# Patient Record
Sex: Female | Born: 1965
Health system: Southern US, Community
[De-identification: ages and names within clinical notes are randomized; demographics above are authoritative.]

## PROBLEM LIST (undated history)

## (undated) DIAGNOSIS — M797 Fibromyalgia: Secondary | ICD-10-CM

## (undated) HISTORY — DX: Fibromyalgia: M79.7

## (undated) HISTORY — PX: SHOULDER SURGERY: SHX246

## (undated) HISTORY — PX: PARTIAL HYSTERECTOMY: SHX80

---

## 2005-01-04 ENCOUNTER — Emergency Department (HOSPITAL_COMMUNITY): Admission: EM | Admit: 2005-01-04 | Discharge: 2005-01-04 | Payer: Self-pay | Admitting: Emergency Medicine

## 2005-01-16 ENCOUNTER — Emergency Department (HOSPITAL_COMMUNITY): Admission: EM | Admit: 2005-01-16 | Discharge: 2005-01-16 | Payer: Self-pay | Admitting: Emergency Medicine

## 2005-03-14 ENCOUNTER — Emergency Department (HOSPITAL_COMMUNITY): Admission: EM | Admit: 2005-03-14 | Discharge: 2005-03-14 | Payer: Self-pay | Admitting: *Deleted

## 2005-04-20 ENCOUNTER — Ambulatory Visit: Payer: Self-pay | Admitting: Psychiatry

## 2005-04-20 ENCOUNTER — Inpatient Hospital Stay (HOSPITAL_COMMUNITY): Admission: EM | Admit: 2005-04-20 | Discharge: 2005-04-27 | Payer: Self-pay | Admitting: Psychiatry

## 2018-08-08 DIAGNOSIS — Z1231 Encounter for screening mammogram for malignant neoplasm of breast: Secondary | ICD-10-CM | POA: Diagnosis not present

## 2018-08-08 DIAGNOSIS — R6889 Other general symptoms and signs: Secondary | ICD-10-CM | POA: Diagnosis not present

## 2018-08-10 DIAGNOSIS — H11133 Conjunctival pigmentations, bilateral: Secondary | ICD-10-CM | POA: Diagnosis not present

## 2018-08-10 DIAGNOSIS — H01024 Squamous blepharitis left upper eyelid: Secondary | ICD-10-CM | POA: Diagnosis not present

## 2018-08-10 DIAGNOSIS — H01022 Squamous blepharitis right lower eyelid: Secondary | ICD-10-CM | POA: Diagnosis not present

## 2018-08-10 DIAGNOSIS — R6889 Other general symptoms and signs: Secondary | ICD-10-CM | POA: Diagnosis not present

## 2018-08-10 DIAGNOSIS — E119 Type 2 diabetes mellitus without complications: Secondary | ICD-10-CM | POA: Diagnosis not present

## 2018-08-10 DIAGNOSIS — H01021 Squamous blepharitis right upper eyelid: Secondary | ICD-10-CM | POA: Diagnosis not present

## 2018-08-10 DIAGNOSIS — H02822 Cysts of right lower eyelid: Secondary | ICD-10-CM | POA: Diagnosis not present

## 2018-08-10 DIAGNOSIS — H11153 Pinguecula, bilateral: Secondary | ICD-10-CM | POA: Diagnosis not present

## 2018-08-10 DIAGNOSIS — H01025 Squamous blepharitis left lower eyelid: Secondary | ICD-10-CM | POA: Diagnosis not present

## 2018-08-10 DIAGNOSIS — H04123 Dry eye syndrome of bilateral lacrimal glands: Secondary | ICD-10-CM | POA: Diagnosis not present

## 2018-08-15 DIAGNOSIS — M797 Fibromyalgia: Secondary | ICD-10-CM | POA: Diagnosis not present

## 2018-08-15 DIAGNOSIS — E6609 Other obesity due to excess calories: Secondary | ICD-10-CM | POA: Diagnosis not present

## 2018-08-15 DIAGNOSIS — E119 Type 2 diabetes mellitus without complications: Secondary | ICD-10-CM | POA: Diagnosis not present

## 2018-08-15 DIAGNOSIS — J309 Allergic rhinitis, unspecified: Secondary | ICD-10-CM | POA: Diagnosis not present

## 2018-08-15 DIAGNOSIS — I10 Essential (primary) hypertension: Secondary | ICD-10-CM | POA: Diagnosis not present

## 2018-08-15 DIAGNOSIS — Z915 Personal history of self-harm: Secondary | ICD-10-CM | POA: Diagnosis not present

## 2018-08-15 DIAGNOSIS — F322 Major depressive disorder, single episode, severe without psychotic features: Secondary | ICD-10-CM | POA: Diagnosis not present

## 2018-08-15 DIAGNOSIS — R6889 Other general symptoms and signs: Secondary | ICD-10-CM | POA: Diagnosis not present

## 2018-08-15 DIAGNOSIS — E785 Hyperlipidemia, unspecified: Secondary | ICD-10-CM | POA: Diagnosis not present

## 2018-08-16 ENCOUNTER — Encounter: Payer: Self-pay | Admitting: Allergy

## 2018-08-16 ENCOUNTER — Ambulatory Visit (INDEPENDENT_AMBULATORY_CARE_PROVIDER_SITE_OTHER): Payer: Medicare HMO | Admitting: Allergy

## 2018-08-16 VITALS — BP 132/82 | HR 76 | Temp 98.0°F | Resp 16 | Ht 69.0 in | Wt 247.6 lb

## 2018-08-16 DIAGNOSIS — Z720 Tobacco use: Secondary | ICD-10-CM

## 2018-08-16 DIAGNOSIS — J309 Allergic rhinitis, unspecified: Secondary | ICD-10-CM | POA: Diagnosis not present

## 2018-08-16 DIAGNOSIS — J3089 Other allergic rhinitis: Secondary | ICD-10-CM

## 2018-08-16 DIAGNOSIS — R062 Wheezing: Secondary | ICD-10-CM

## 2018-08-16 DIAGNOSIS — H1013 Acute atopic conjunctivitis, bilateral: Secondary | ICD-10-CM

## 2018-08-16 DIAGNOSIS — R6889 Other general symptoms and signs: Secondary | ICD-10-CM | POA: Diagnosis not present

## 2018-08-16 DIAGNOSIS — H1010 Acute atopic conjunctivitis, unspecified eye: Secondary | ICD-10-CM | POA: Diagnosis not present

## 2018-08-16 MED ORDER — CARBINOXAMINE MALEATE 6 MG PO TABS: 6.0000 mg | ORAL_TABLET | Freq: Two times a day (BID) | ORAL | 3 refills | Status: DC

## 2018-08-16 MED ORDER — ALBUTEROL SULFATE HFA 108 (90 BASE) MCG/ACT IN AERS: INHALATION_SPRAY | RESPIRATORY_TRACT | 5 refills | Status: DC

## 2018-08-16 MED ORDER — OLOPATADINE HCL 0.2 % OP SOLN: 1.0000 [drp] | Freq: Every day | OPHTHALMIC | 3 refills | Status: DC

## 2018-08-16 MED ORDER — MONTELUKAST SODIUM 10 MG PO TABS: 10.0000 mg | ORAL_TABLET | Freq: Every day | ORAL | 3 refills | Status: DC

## 2018-08-16 NOTE — Progress Notes (Signed)
New Patient Note  RE: Virginia Garcia MRN: [id number] DOB: 1966-02-16 Date of Office Visit: 08/16/2018  Referring provider: No ref. provider found Primary care provider: Renaye Rakers, MD  Chief Complaint: Allergies  History of present illness: Virginia Garcia is a 51 y.o. female presenting today for evaluation of allergies.  She has been dealing with allergy symptoms for the past 3 years.  She states she used to have seasonal allergies but symptoms lately have not gone away as the seasons have changed.  She is taking xyzal and using fluticasone spray. She has tried zyrtec, claritin, allegra in the past which has not helped relieve her symptoms.  She reports her symptoms include runny and stuffy nose, irritated ears, sneezing, throat clearing.  She states her eye symptoms are not that bothersome but does report some itch and watering.  She feels that the symptoms have been progressively worsening.  She has had allergy testing before about several years ago by an allergist in [place].  She recalls being positive to grass, dog, dust mites, tree.  She also performed allergy shots but states she was having rashes with the injections and thus stopped and thinks she only did about 6 months or so.    She is a smoker. She has an albuterol that she uses about once a week for wheezing.  She states that when she does need to use it she is using it 1-2 times that day.  She states she was provided with the albuterol because of wheezing that she states is due to her smoking.  She denies any significant cough no sputum production and no chest tightness.  She denies nighttime awakenings.  She denies any ED or urgent care visits or hospitalizations for her breathing related issues.  She denies any need for oral steroids.  No history of eczema or food allergy.    Review of systems: Review of Systems  Constitutional: Negative for chills, fever and malaise/fatigue.  HENT: Positive for congestion. Negative for  ear discharge, ear pain, nosebleeds, sinus pain and sore throat.   Eyes: Negative for pain, discharge and redness.  Respiratory: Positive for wheezing. Negative for cough, sputum production and shortness of breath.   Cardiovascular: Negative for chest pain.  Gastrointestinal: Negative for abdominal pain, constipation, diarrhea, heartburn, nausea and vomiting.  Musculoskeletal: Negative for joint pain.  Skin: Negative for itching and rash.  Neurological: Negative for headaches.    All other systems negative unless noted above in HPI  Past medical history: Past Medical History:  Diagnosis Date  . Fibromyalgia     Past surgical history: Past Surgical History:  Procedure Laterality Date  . PARTIAL HYSTERECTOMY     1987  . SHOULDER SURGERY     2015    Family history:  History reviewed. No pertinent family history.  Social history: She lives in a house with carpeting with electric heating and central cooling.  No pets in the home.  No concern for water damage, mildew or roaches in the home.  She is disabled and currently is not working.  She is a current everyday smoker of half a pack for the past 45 years  Medication List: Allergies as of 08/16/2018      Reactions   Meloxicam    Bruising    Paxil [paroxetine Hcl] Hives      Medication List        Accurate as of 08/16/18 11:01 AM. Always use your most recent med list.  FLUoxetine 40 MG capsule Commonly known as:  PROZAC Take 40 mg by mouth daily.   fluticasone 50 MCG/ACT nasal spray Commonly known as:  FLONASE one TO two SPRAYS in each nostril EVERY DAY   furosemide 20 MG tablet Commonly known as:  LASIX Take 20 mg by mouth.   gabapentin 300 MG capsule Commonly known as:  NEURONTIN Take 300 mg by mouth 3 (three) times daily.   levocetirizine 5 MG tablet Commonly known as:  XYZAL Take 5 mg by mouth daily.   losartan-hydrochlorothiazide 50-12.5 MG tablet Commonly known as:  HYZAAR Take 1 tablet  by mouth daily. for blood pressure   Melatonin 5 MG Tabs   pantoprazole 40 MG tablet Commonly known as:  PROTONIX Take 40 mg by mouth daily.   pregabalin 100 MG capsule Commonly known as:  LYRICA Take 100 mg by mouth 2 (two) times daily.   rosuvastatin 10 MG tablet Commonly known as:  CRESTOR TK 1 T PO D   SYNJARDY 03-999 MG Tabs Generic drug:  Empagliflozin-metFORMIN HCl TK 1 T PO BID WITH FOOD   traZODone 50 MG tablet Commonly known as:  DESYREL Take 50 mg by mouth at bedtime.   TRULICITY 0.75 MG/0.5ML Sopn Generic drug:  Dulaglutide INJECT 1 DOSE SQ Q WEEK   VITAMIN C PO Take by mouth.   VITAMIN D PO Take by mouth.       Known medication allergies: Allergies  Allergen Reactions  . Meloxicam     Bruising   . Paxil [Paroxetine Hcl] Hives     Physical examination: Blood pressure 132/82, pulse 76, temperature 98 F (36.7 C), temperature source Oral, resp. rate 16, height 5\' 9"  (1.753 m), weight 247 lb 9.6 oz (112.3 kg), SpO2 98 %.  General: Alert, interactive, in no acute distress. HEENT: PERRLA, TMs pearly gray, turbinates moderately edematous with clear discharge, post-pharynx non erythematous. Neck: Supple without lymphadenopathy. Lungs: Clear to auscultation without wheezing, rhonchi or rales. {no increased work of breathing. CV: Normal S1, S2 without murmurs. Abdomen: Nondistended, nontender. Skin: Warm and dry, without lesions or rashes. Extremities:  No clubbing, cyanosis or edema. Neuro:   Grossly intact.  Diagnositics/Labs:  Spirometry: FEV1: 1.99L  73%, FVC: 2.66L 78%  Allergy testing: Histamine was nonreactive this test is not interpretable Allergy testing results were read and interpreted by provider, documented by clinical staff.   Assessment and plan:   Allergic rhinoconjunctivitis   - skin testing today was not reactive thus will obtain environmental allergen panel   - stop xyzal and will have you trial Ryvent 6mg  twice a day   -  trial dymista 1 spray each nostril twice a day.  This is a combination nasal spray with Flonase + Astelin (nasal antihistamine).  This helps with both nasal congestion and drainage.  If Dymista is not covered then will prescribe Astelin separately   - for itchy/watery/red eyes use Pazeo or Pataday 1 drop each eye daily as needed   - nasal saline rinse prior to use of nasal sprays to help clean out the nose  - immunotherapy was discussed today if medication management is not effective  Wheezing   - lung function testing today is slightly low   - start singulair 10mg  daily - take at bedtime   - have access to albuterol inhaler 2 puffs every 4-6 hours as needed for cough/wheeze/shortness of breath/chest tightness.  May use 15-20 minutes prior to activity.   Monitor frequency of use.     -  discussed smoking cessation and identifying a quit date  Control goals:   Full participation in all desired activities (may need albuterol before activity)  Albuterol use two time or less a week on average (not counting use with activity)  Cough interfering with sleep two time or less a month  Oral steroids no more than once a year  No hospitalizations  Tobacco use  - discussed smoking cessation and identifying a quit date.  She does appear interested in quitting smoking.  - lung function is slighlty reduced for age and discussed smoking cessation would help to reduce how fast she loses her lung function.    Follow-up 3-4 months or sooner if needed  I appreciate the opportunity to take part in Tyniya's care. Please do not hesitate to contact me with questions.  Sincerely,   Margo Aye, MD Allergy/Immunology [facility]

## 2018-08-16 NOTE — Patient Instructions (Addendum)
Allergies   - skin testing today was not reactive thus will obtain environmental allergen panel   - stop xyzal and will have you trial Ryvent 6mg  twice a day   - trial dymista 1 spray each nostril twice a day.  This is a combination nasal spray with Flonase + Astelin (nasal antihistamine).  This helps with both nasal congestion and drainage.  If Dymista is not covered then will prescribe Astelin separately   - for itchy/watery/red eyes use Pazeo or Pataday 1 drop each eye daily as needed   - nasal saline rinse prior to use of nasal sprays to help clean out the nose  Wheezing   - lung function testing today is slightly low   - start singulair 10mg  daily - take at bedtime   - have access to albuterol inhaler 2 puffs every 4-6 hours as needed for cough/wheeze/shortness of breath/chest tightness.  May use 15-20 minutes prior to activity.   Monitor frequency of use.    Control goals:   Full participation in all desired activities (may need albuterol before activity)  Albuterol use two time or less a week on average (not counting use with activity)  Cough interfering with sleep two time or less a month  Oral steroids no more than once a year  No hospitalizations  Follow-up 3-4 months or sooner if needed

## 2018-08-17 ENCOUNTER — Other Ambulatory Visit: Payer: Self-pay | Admitting: *Deleted

## 2018-08-17 MED ORDER — ALBUTEROL SULFATE HFA 108 (90 BASE) MCG/ACT IN AERS: 2.0000 | INHALATION_SPRAY | RESPIRATORY_TRACT | 5 refills | Status: DC | PRN

## 2018-08-19 LAB — IGE+ALLERGENS ZONE 2(30)
Alternaria Alternata IgE: <0.1 kU/L
Bermuda Grass IgE: <0.1 kU/L
Cat Dander IgE: <0.1 kU/L
Cedar, Mountain IgE: <0.1 kU/L
Common Silver Birch IgE: <0.1 kU/L
Dog Dander IgE: <0.1 kU/L
Hickory, White IgE: <0.1 kU/L
IGE (IMMUNOGLOBULIN E), SERUM: 11 [IU]/mL (ref 6–495)
Mucor Racemosus IgE: <0.1 kU/L
Mugwort IgE Qn: <0.1 kU/L
Nettle IgE: <0.1 kU/L
Oak, White IgE: <0.1 kU/L
Plantain, English IgE: <0.1 kU/L
Stemphylium Herbarum IgE: <0.1 kU/L
Sweet gum IgE RAST Ql: <0.1 kU/L
Timothy Grass IgE: <0.1 kU/L
White Mulberry IgE: <0.1 kU/L

## 2018-08-19 LAB — CBC WITH DIFFERENTIAL/PLATELET
BASOS ABS: 0 10*3/uL (ref 0.0–0.2)
Basos: 1 %
EOS (ABSOLUTE): 0.2 10*3/uL (ref 0.0–0.4)
EOS: 3 %
HEMATOCRIT: 38.7 % (ref 34.0–46.6)
HEMOGLOBIN: 13.2 g/dL (ref 11.1–15.9)
IMMATURE GRANS (ABS): 0 10*3/uL (ref 0.0–0.1)
IMMATURE GRANULOCYTES: 0 %
LYMPHS: 48 %
Lymphocytes Absolute: 3.3 10*3/uL — ABNORMAL HIGH (ref 0.7–3.1)
MCH: 30.4 pg (ref 26.6–33.0)
MCHC: 34.1 g/dL (ref 31.5–35.7)
MCV: 89 fL (ref 79–97)
MONOCYTES: 5 %
Monocytes Absolute: 0.3 10*3/uL (ref 0.1–0.9)
Neutrophils Absolute: 3 10*3/uL (ref 1.4–7.0)
Neutrophils: 43 %
Platelets: 335 10*3/uL (ref 150–450)
RBC: 4.34 x10E6/uL (ref 3.77–5.28)
RDW: 13.5 % (ref 12.3–15.4)
WBC: 6.8 10*3/uL (ref 3.4–10.8)

## 2018-08-29 DIAGNOSIS — E6609 Other obesity due to excess calories: Secondary | ICD-10-CM | POA: Diagnosis not present

## 2018-08-29 DIAGNOSIS — F322 Major depressive disorder, single episode, severe without psychotic features: Secondary | ICD-10-CM | POA: Diagnosis not present

## 2018-08-29 DIAGNOSIS — E119 Type 2 diabetes mellitus without complications: Secondary | ICD-10-CM | POA: Diagnosis not present

## 2018-08-29 DIAGNOSIS — R6889 Other general symptoms and signs: Secondary | ICD-10-CM | POA: Diagnosis not present

## 2018-08-29 DIAGNOSIS — Z6837 Body mass index (BMI) 37.0-37.9, adult: Secondary | ICD-10-CM | POA: Diagnosis not present

## 2018-08-29 DIAGNOSIS — E785 Hyperlipidemia, unspecified: Secondary | ICD-10-CM | POA: Diagnosis not present

## 2018-08-29 DIAGNOSIS — M797 Fibromyalgia: Secondary | ICD-10-CM | POA: Diagnosis not present

## 2018-08-29 DIAGNOSIS — Z915 Personal history of self-harm: Secondary | ICD-10-CM | POA: Diagnosis not present

## 2018-09-12 ENCOUNTER — Other Ambulatory Visit: Payer: Self-pay | Admitting: Family Medicine

## 2018-09-12 ENCOUNTER — Other Ambulatory Visit (HOSPITAL_COMMUNITY)
Admission: RE | Admit: 2018-09-12 | Discharge: 2018-09-12 | Disposition: A | Discharge status: Home / Self Care | Payer: Medicare HMO | Source: Ambulatory Visit | Attending: Family Medicine | Admitting: Family Medicine

## 2018-09-12 DIAGNOSIS — N958 Other specified menopausal and perimenopausal disorders: Secondary | ICD-10-CM | POA: Diagnosis not present

## 2018-09-12 DIAGNOSIS — L292 Pruritus vulvae: Secondary | ICD-10-CM | POA: Diagnosis not present

## 2018-09-12 DIAGNOSIS — R6889 Other general symptoms and signs: Secondary | ICD-10-CM | POA: Diagnosis not present

## 2018-09-12 DIAGNOSIS — M797 Fibromyalgia: Secondary | ICD-10-CM | POA: Diagnosis not present

## 2018-09-12 DIAGNOSIS — I10 Essential (primary) hypertension: Secondary | ICD-10-CM | POA: Diagnosis not present

## 2018-09-12 DIAGNOSIS — F331 Major depressive disorder, recurrent, moderate: Secondary | ICD-10-CM | POA: Diagnosis not present

## 2018-09-12 DIAGNOSIS — N898 Other specified noninflammatory disorders of vagina: Secondary | ICD-10-CM | POA: Insufficient documentation

## 2018-09-13 DIAGNOSIS — N898 Other specified noninflammatory disorders of vagina: Secondary | ICD-10-CM | POA: Diagnosis not present

## 2018-09-14 LAB — URINE CYTOLOGY ANCILLARY ONLY
BACTERIAL VAGINITIS: NEGATIVE
Candida vaginitis: NEGATIVE
Chlamydia: NEGATIVE
Neisseria Gonorrhea: NEGATIVE
Trichomonas: NEGATIVE

## 2018-11-06 ENCOUNTER — Other Ambulatory Visit: Payer: Self-pay

## 2018-11-06 MED ORDER — ALBUTEROL SULFATE HFA 108 (90 BASE) MCG/ACT IN AERS: 2.0000 | INHALATION_SPRAY | RESPIRATORY_TRACT | 4 refills | Status: AC | PRN

## 2018-11-06 MED ORDER — MONTELUKAST SODIUM 10 MG PO TABS: 10.0000 mg | ORAL_TABLET | Freq: Every day | ORAL | 0 refills | Status: DC

## 2018-11-06 MED ORDER — OLOPATADINE HCL 0.2 % OP SOLN: 1.0000 [drp] | Freq: Every day | OPHTHALMIC | 3 refills | Status: DC

## 2018-11-06 NOTE — Telephone Encounter (Signed)
Refills sent in for montelukast, Ventolin HFA, and Olopatadine  11/06/18 sent to [facility] pharmacy

## 2018-11-12 ENCOUNTER — Other Ambulatory Visit: Payer: Self-pay | Admitting: Allergy and Immunology

## 2018-11-12 DIAGNOSIS — R6889 Other general symptoms and signs: Secondary | ICD-10-CM | POA: Diagnosis not present

## 2018-11-12 DIAGNOSIS — E669 Obesity, unspecified: Secondary | ICD-10-CM | POA: Diagnosis not present

## 2018-11-13 ENCOUNTER — Other Ambulatory Visit: Payer: Self-pay | Admitting: Allergy and Immunology

## 2018-12-19 ENCOUNTER — Ambulatory Visit (INDEPENDENT_AMBULATORY_CARE_PROVIDER_SITE_OTHER): Payer: Medicare HMO | Admitting: Allergy

## 2018-12-19 ENCOUNTER — Encounter: Payer: Self-pay | Admitting: Allergy

## 2018-12-19 VITALS — BP 128/82 | HR 87 | Resp 16

## 2018-12-19 DIAGNOSIS — J310 Chronic rhinitis: Secondary | ICD-10-CM | POA: Diagnosis not present

## 2018-12-19 DIAGNOSIS — Z720 Tobacco use: Secondary | ICD-10-CM | POA: Diagnosis not present

## 2018-12-19 DIAGNOSIS — H1089 Other conjunctivitis: Secondary | ICD-10-CM | POA: Diagnosis not present

## 2018-12-19 DIAGNOSIS — R062 Wheezing: Secondary | ICD-10-CM | POA: Diagnosis not present

## 2018-12-19 DIAGNOSIS — R6889 Other general symptoms and signs: Secondary | ICD-10-CM | POA: Diagnosis not present

## 2018-12-19 MED ORDER — OLOPATADINE HCL 0.2 % OP SOLN: OPHTHALMIC | 1 refills | Status: AC

## 2018-12-19 MED ORDER — AZELASTINE HCL 0.1 % NA SOLN: 2.0000 | Freq: Two times a day (BID) | NASAL | 5 refills | Status: DC

## 2018-12-19 NOTE — Patient Instructions (Addendum)
Non-allergic rhinitis   - skin and blood allergy testing was negative   - trial Karbinal take 8mg  (73ml) twice a day - this is an antihistamine   - use dymista 1 spray each nostril twice a day.  This is a combination nasal spray with Flonase + Astelin (nasal antihistamine).  This helps with both nasal congestion and drainage.  If Dymista is not covered then will prescribe Astelin separately   - for itchy/watery/red eyes use Olopatadine 1 drop each eye daily as needed   - nasal saline rinse prior to use of nasal sprays to help clean out the nose  Wheezing   - continue singulair 10mg  daily - take at bedtime   - have access to albuterol inhaler 2 puffs every 4-6 hours as needed for cough/wheeze/shortness of breath/chest tightness.  May use 15-20 minutes prior to activity.   Monitor frequency of use.    Control goals:   Full participation in all desired activities (may need albuterol before activity)  Albuterol use two time or less a week on average (not counting use with activity)  Cough interfering with sleep two time or less a month  Oral steroids no more than once a year  No hospitalizations  Tobacco use   - we discussed today thinking about cutting back smoking and setting a goal to cut back  Follow-up 3-4 months or sooner if needed

## 2018-12-19 NOTE — Progress Notes (Signed)
Follow-up Note  RE: Virginia Garcia MRN: [id number] DOB: [birth year] Date of Office Visit: 12/19/2018   History of present illness: Virginia Garcia is a 53 y.o. female presenting today for follow-up of nonallergic rhinitis with conjunctivitis was wheezing secondary to tobacco use.  She was last seen in the office on August 16, 2018 by myself.  At that visit her allergy skin prick testing was not reactive thus we elected to obtain serum IgE testing which was negative. She states that she did complete the Dymista sample provided after last visit and that this was a somewhat helpful for her however when the sample ran out she resume use of Flonase which she does not feel to be effective at all.  She states she has used the olopatadine eyedrop when she develops itchy watery eyes and this does help.  She was not able to get the RyVent as she states it was too expensive. She states she is still having runny nose and itchy eyes. She is still smoking about half a pack every 3 days or so and she has not been able to set a quit date or thought about cutting back.  She states however she would like to eventually quit at some point. She states she has needed to use her albuterol inhaler about twice a month when she does feel a bit short of breath and wheezing and it does provide relief.  Review of systems: Review of Systems  Constitutional: Negative for chills, fever and malaise/fatigue.  HENT: Positive for congestion. Negative for ear discharge, nosebleeds, sinus pain and sore throat.   Eyes: Negative for pain, discharge and redness.  Respiratory: Positive for wheezing. Negative for cough, sputum production and shortness of breath.   Cardiovascular: Negative for chest pain.  Gastrointestinal: Negative for abdominal pain, constipation, diarrhea, heartburn, nausea and vomiting.  Musculoskeletal: Negative for joint pain.  Skin: Negative for itching and rash.  Neurological: Negative for headaches.    All  other systems negative unless noted above in HPI  Past medical/social/surgical/family history have been reviewed and are unchanged unless specifically indicated below.  No changes  Medication List: Allergies as of 12/19/2018      Reactions   Meloxicam    Bruising    Paxil [paroxetine Hcl] Hives      Medication List       Accurate as of December 19, 2018  2:42 PM. Always use your most recent med list.        albuterol 108 (90 Base) MCG/ACT inhaler Commonly known as:  VENTOLIN HFA Inhale 2 puffs into the lungs every 4 (four) hours as needed for wheezing or shortness of breath.   azelastine 0.1 % nasal spray Commonly known as:  ASTELIN Place 2 sprays into both nostrils 2 (two) times daily.   Carbinoxamine Maleate 6 MG Tabs Commonly known as:  RYVENT Take 6 mg by mouth 2 (two) times daily.   FLUoxetine 40 MG capsule Commonly known as:  PROZAC Take 40 mg by mouth daily.   fluticasone 50 MCG/ACT nasal spray Commonly known as:  FLONASE one TO two SPRAYS in each nostril EVERY DAY   furosemide 20 MG tablet Commonly known as:  LASIX Take 20 mg by mouth.   gabapentin 300 MG capsule Commonly known as:  NEURONTIN Take 300 mg by mouth 3 (three) times daily.   losartan-hydrochlorothiazide 50-12.5 MG tablet Commonly known as:  HYZAAR Take 1 tablet by mouth daily. for blood pressure   Melatonin 5 MG Tabs  montelukast 10 MG tablet Commonly known as:  SINGULAIR TAKE 1 TABLET (10 MG TOTAL) BY MOUTH AT BEDTIME.   Olopatadine HCl 0.2 % Soln INSTILL 1 DROP INTO BOTH EYES DAILY.   pantoprazole 40 MG tablet Commonly known as:  PROTONIX Take 40 mg by mouth daily.   pregabalin 100 MG capsule Commonly known as:  LYRICA Take 100 mg by mouth 2 (two) times daily.   rosuvastatin 10 MG tablet Commonly known as:  CRESTOR TK 1 T PO D   SYNJARDY 03-999 MG Tabs Generic drug:  Empagliflozin-metFORMIN HCl TK 1 T PO BID WITH FOOD   traZODone 50 MG tablet Commonly known as:   DESYREL Take 50 mg by mouth at bedtime.   TRULICITY 0.75 MG/0.5ML Sopn Generic drug:  Dulaglutide INJECT 1 DOSE SQ Q WEEK   VITAMIN C PO Take by mouth.   VITAMIN D PO Take by mouth.       Known medication allergies: Allergies  Allergen Reactions  . Meloxicam     Bruising   . Paxil [Paroxetine Hcl] Hives     Physical examination: Blood pressure 128/82, pulse 87, resp. rate 16, SpO2 97 %.  General: Alert, interactive, in no acute distress. HEENT: PERRLA, TMs pearly gray, turbinates mildly edematous without discharge, post-pharynx non erythematous. Neck: Supple without lymphadenopathy. Lungs: Clear to auscultation without wheezing, rhonchi or rales. {no increased work of breathing. CV: Normal S1, S2 without murmurs. Abdomen: Nondistended, nontender. Skin: Warm and dry, without lesions or rashes. Extremities:  No clubbing, cyanosis or edema. Neuro:   Grossly intact.  Diagnositics/Labs: Labs:  Component     Latest Ref Rng & Units 08/16/2018  IgE (Immunoglobulin E), Serum     6 - 495 IU/mL 11  D Pteronyssinus IgE     Class 0 kU/L <0.10  D Farinae IgE     Class 0 kU/L <0.10  Cat Dander IgE     Class 0 kU/L <0.10  Dog Dander IgE     Class 0 kU/L <0.10  [place] Grass IgE     Class 0 kU/L <0.10  Timothy Grass IgE     Class 0 kU/L <0.10  Johnson Grass IgE     Class 0 kU/L <0.10  Bahia Grass IgE     Class 0 kU/L <0.10  Cockroach, American IgE     Class 0 kU/L <0.10  Penicillium Chrysogen IgE     Class 0 kU/L <0.10  Cladosporium Herbarum IgE     Class 0 kU/L <0.10  Aspergillus Fumigatus IgE     Class 0 kU/L <0.10  Mucor Racemosus IgE     Class 0 kU/L <0.10  Alternaria Alternata IgE     Class 0 kU/L <0.10  Stemphylium Herbarum IgE     Class 0 kU/L <0.10  Common Silver Charletta CousinBirch IgE     Class 0 kU/L <0.10  Oak, White IgE     Class 0 kU/L <0.10  Elm, American IgE     Class 0 kU/L <0.10  Maple/Box Elder IgE     Class 0 kU/L <0.10  Hickory, White IgE      Class 0 kU/L <0.10  Amer Sycamore IgE Qn     Class 0 kU/L <0.10  White Mulberry IgE     Class 0 kU/L <0.10  Sweet gum IgE RAST Ql     Class 0 kU/L <0.10  Cedar, [place] IgE     Class 0 kU/L <0.10  Ragweed, Short IgE     Class 0  kU/L <0.10  Mugwort IgE Qn     Class 0 kU/L <0.10  Plantain, English IgE     Class 0 kU/L <0.10  Pigweed, Rough IgE     Class 0 kU/L <0.10  Sheep Sorrel IgE Qn     Class 0 kU/L <0.10  Nettle IgE     Class 0 kU/L <0.10   Component     Latest Ref Rng & Units 08/16/2018  WBC     3.4 - 10.8 x10E3/uL 6.8  RBC     3.77 - 5.28 x10E6/uL 4.34  Hemoglobin     11.1 - 15.9 g/dL 16.113.2  HCT     09.634.0 - 04.546.6 % 38.7  MCV     79 - 97 fL 89  MCH     26.6 - 33.0 pg 30.4  MCHC     31.5 - 35.7 g/dL 40.934.1  RDW     81.112.3 - 91.415.4 % 13.5  Platelets     150 - 450 x10E3/uL 335  Neutrophils     Not Estab. % 43  Lymphs     Not Estab. % 48  Monocytes     Not Estab. % 5  Eos     Not Estab. % 3  Basos     Not Estab. % 1  NEUT#     1.4 - 7.0 x10E3/uL 3.0  Lymphocyte #     0.7 - 3.1 x10E3/uL 3.3 (H)  Monocytes Absolute     0.1 - 0.9 x10E3/uL 0.3  EOS (ABSOLUTE)     0.0 - 0.4 x10E3/uL 0.2  Basophils Absolute     0.0 - 0.2 x10E3/uL 0.0  Immature Granulocytes     Not Estab. % 0  Immature Grans (Abs)     0.0 - 0.1 x10E3/uL 0.0   Assessment and plan:   Non-allergic rhinitis   - skin and blood allergy testing was negative   - trial Karbinal take 8mg  (10ml) twice a day - this is an antihistamine   - use dymista 1 spray each nostril twice a day.  This is a combination nasal spray with Flonase + Astelin (nasal antihistamine).  This helps with both nasal congestion and drainage.  If Dymista is not covered then will prescribe Astelin separately   - for itchy/watery/red eyes use Olopatadine 1 drop each eye daily as needed   - nasal saline rinse prior to use of nasal sprays to help clean out the nose  Wheezing   - continue singulair 10mg  daily - take at bedtime   -  have access to albuterol inhaler 2 puffs every 4-6 hours as needed for cough/wheeze/shortness of breath/chest tightness.  May use 15-20 minutes prior to activity.   Monitor frequency of use.    Control goals:   Full participation in all desired activities (may need albuterol before activity)  Albuterol use two time or less a week on average (not counting use with activity)  Cough interfering with sleep two time or less a month  Oral steroids no more than once a year  No hospitalizations  Tobacco use   - we discussed today thinking about cutting back smoking and setting a goal date to cut back  Follow-up 3-4 months or sooner if needed  I appreciate the opportunity to take part in Davianna's care. Please do not hesitate to contact me with questions.  Sincerely,   Margo AyeShaylar , MD Allergy/Immunology [facility]

## 2018-12-26 DIAGNOSIS — E6609 Other obesity due to excess calories: Secondary | ICD-10-CM | POA: Diagnosis not present

## 2018-12-26 DIAGNOSIS — F331 Major depressive disorder, recurrent, moderate: Secondary | ICD-10-CM | POA: Diagnosis not present

## 2018-12-26 DIAGNOSIS — E119 Type 2 diabetes mellitus without complications: Secondary | ICD-10-CM | POA: Diagnosis not present

## 2018-12-26 DIAGNOSIS — R6889 Other general symptoms and signs: Secondary | ICD-10-CM | POA: Diagnosis not present

## 2018-12-26 DIAGNOSIS — M797 Fibromyalgia: Secondary | ICD-10-CM | POA: Diagnosis not present

## 2018-12-26 DIAGNOSIS — I959 Hypotension, unspecified: Secondary | ICD-10-CM | POA: Diagnosis not present

## 2018-12-26 DIAGNOSIS — Z915 Personal history of self-harm: Secondary | ICD-10-CM | POA: Diagnosis not present

## 2018-12-31 DIAGNOSIS — R6889 Other general symptoms and signs: Secondary | ICD-10-CM | POA: Diagnosis not present

## 2018-12-31 DIAGNOSIS — Z0000 Encounter for general adult medical examination without abnormal findings: Secondary | ICD-10-CM | POA: Diagnosis not present

## 2018-12-31 DIAGNOSIS — M797 Fibromyalgia: Secondary | ICD-10-CM | POA: Diagnosis not present

## 2018-12-31 DIAGNOSIS — E785 Hyperlipidemia, unspecified: Secondary | ICD-10-CM | POA: Diagnosis not present

## 2018-12-31 DIAGNOSIS — E669 Obesity, unspecified: Secondary | ICD-10-CM | POA: Diagnosis not present

## 2019-01-14 DIAGNOSIS — Z915 Personal history of self-harm: Secondary | ICD-10-CM | POA: Diagnosis not present

## 2019-01-14 DIAGNOSIS — M797 Fibromyalgia: Secondary | ICD-10-CM | POA: Diagnosis not present

## 2019-01-14 DIAGNOSIS — F331 Major depressive disorder, recurrent, moderate: Secondary | ICD-10-CM | POA: Diagnosis not present

## 2019-01-14 DIAGNOSIS — R6889 Other general symptoms and signs: Secondary | ICD-10-CM | POA: Diagnosis not present

## 2019-01-14 DIAGNOSIS — E785 Hyperlipidemia, unspecified: Secondary | ICD-10-CM | POA: Diagnosis not present

## 2019-01-14 DIAGNOSIS — E669 Obesity, unspecified: Secondary | ICD-10-CM | POA: Diagnosis not present

## 2019-01-14 DIAGNOSIS — I10 Essential (primary) hypertension: Secondary | ICD-10-CM | POA: Diagnosis not present

## 2019-02-06 ENCOUNTER — Other Ambulatory Visit: Payer: Self-pay

## 2019-02-06 MED ORDER — MONTELUKAST SODIUM 10 MG PO TABS: 10.0000 mg | ORAL_TABLET | Freq: Every day | ORAL | 1 refills | Status: DC

## 2019-03-20 DIAGNOSIS — M797 Fibromyalgia: Secondary | ICD-10-CM | POA: Diagnosis not present

## 2019-03-20 DIAGNOSIS — F331 Major depressive disorder, recurrent, moderate: Secondary | ICD-10-CM | POA: Diagnosis not present

## 2019-03-20 DIAGNOSIS — E6609 Other obesity due to excess calories: Secondary | ICD-10-CM | POA: Diagnosis not present

## 2019-03-20 DIAGNOSIS — I10 Essential (primary) hypertension: Secondary | ICD-10-CM | POA: Diagnosis not present

## 2019-03-20 DIAGNOSIS — R1013 Epigastric pain: Secondary | ICD-10-CM | POA: Diagnosis not present

## 2019-03-20 DIAGNOSIS — R6889 Other general symptoms and signs: Secondary | ICD-10-CM | POA: Diagnosis not present

## 2019-03-20 DIAGNOSIS — Z7189 Other specified counseling: Secondary | ICD-10-CM | POA: Diagnosis not present

## 2019-03-20 DIAGNOSIS — E1169 Type 2 diabetes mellitus with other specified complication: Secondary | ICD-10-CM | POA: Diagnosis not present

## 2019-03-27 ENCOUNTER — Ambulatory Visit: Payer: Medicare HMO | Admitting: Allergy

## 2019-03-27 ENCOUNTER — Telehealth (INDEPENDENT_AMBULATORY_CARE_PROVIDER_SITE_OTHER): Payer: Medicare HMO | Admitting: Allergy

## 2019-03-27 ENCOUNTER — Other Ambulatory Visit: Payer: Self-pay

## 2019-03-27 ENCOUNTER — Encounter: Payer: Self-pay | Admitting: Allergy

## 2019-03-27 DIAGNOSIS — J310 Chronic rhinitis: Secondary | ICD-10-CM

## 2019-03-27 DIAGNOSIS — R062 Wheezing: Secondary | ICD-10-CM

## 2019-03-27 DIAGNOSIS — H1089 Other conjunctivitis: Secondary | ICD-10-CM | POA: Diagnosis not present

## 2019-03-27 MED ORDER — FLUTICASONE PROPIONATE 50 MCG/ACT NA SUSP: 2.0000 | Freq: Every day | NASAL | 5 refills | Status: DC

## 2019-03-27 MED ORDER — CARBINOXAMINE MALEATE ER 4 MG/5ML PO SUER: 8.0000 mg | Freq: Two times a day (BID) | ORAL | 5 refills | Status: DC

## 2019-03-27 MED ORDER — MONTELUKAST SODIUM 10 MG PO TABS: 10.0000 mg | ORAL_TABLET | Freq: Every day | ORAL | 1 refills | Status: DC

## 2019-03-27 MED ORDER — AZELASTINE HCL 0.1 % NA SOLN: 2.0000 | Freq: Two times a day (BID) | NASAL | 5 refills | Status: DC

## 2019-03-27 MED ORDER — CARBINOXAMINE MALEATE ER 4 MG/5ML PO SUER: 8.0000 mg | Freq: Two times a day (BID) | ORAL | 5 refills | Status: AC

## 2019-03-27 NOTE — Progress Notes (Signed)
RE: Virginia Garcia MRN: [id number] DOB: [birth year] Date of Telemedicine Visit: 03/27/2019  Referring provider: Renaye Rakers, MD Primary care provider: Renaye Rakers, MD  Chief Complaint: Non-allergic rhinitis   Telemedicine Follow Up Visit via MyChart Video: I connected with Virginia Garcia for a follow up on 03/27/19 by telephone and verified that I am speaking with the correct person using two identifiers.   I discussed the limitations, risks, security and privacy concerns of performing an evaluation and management service by telephone and the availability of in person appointments. I also discussed with the patient that there may be a patient responsible charge related to this service. The patient expressed understanding and agreed to proceed.  Patient is at home.  Provider is at the office.  Visit start time: 0955 Visit end time: 74 Insurance consent/check in by: Marlene Bast Medical consent and medical assistant/nurse: Darreld Mclean S  History of Present Illness: She is a 53 y.o. female, who is being followed for non-allergic rhinitis, wheezing. Her previous allergy office visit was on 12/19/18 with Dr. Delorse Lek.   She states that her symptoms have not really gotten any better since her last visit.  However she does state that the [place] was helpful but she ran out and this is requesting refills.  She also states the Dymista was helpful somewhat with her nasal drainage but she also ran out over that as well and it was too expensive for her to get.  She also states that the olopatadine does help when she needs to use it for her eyes.  Her biggest complaint at this time is the runny nose that she is having.  She states that she continues to take Singulair but she does not feel like it is helping.  When asked if she has been having wheezing she states no.  She also states that she has not needed to use her albuterol since the last visit.  She states that she was not aware that the Singulair was for her  wheezing because she thought it was not helping with her allergy symptoms at all.  Assessment and Plan: Virginia Garcia is a 53 y.o. female with:   Non-allergic rhinitis   - skin and blood allergy testing was negative   - continue Lenor Derrick take 8mg  (79ml) twice a day - this is an antihistamine.     -Use Astelin, nasal antihistamine, 2 sprays each nostril twice a day for control of a runny nose.  This appears to be covered by her insurance plan.   -Use Flonase 2 sprays each nostril daily as needed for nasal congestion.  This is the nasal steroid that appears to be covered by her insurance plan.   - for itchy/watery/red eyes use Olopatadine 1 drop each eye daily as needed   - nasal saline rinse prior to use of nasal sprays to help clean out the nose  Wheezing   - continue singulair 10mg  daily - take at bedtime   - have access to albuterol inhaler 2 puffs every 4-6 hours as needed for cough/wheeze/shortness of breath/chest tightness.  May use 15-20 minutes prior to activity.   Monitor frequency of use.    Control goals:   Full participation in all desired activities (may need albuterol before activity)  Albuterol use two time or less a week on average (not counting use with activity)  Cough interfering with sleep two time or less a month  Oral steroids no more than once a year  No hospitalizations   Follow-up 2-3 months  or sooner if needed  Diagnostics: None.  Medication List:  Current Outpatient Medications  Medication Sig Dispense Refill  . albuterol (VENTOLIN HFA) 108 (90 Base) MCG/ACT inhaler Inhale 2 puffs into the lungs every 4 (four) hours as needed for wheezing or shortness of breath. 1 Inhaler 4  . Ascorbic Acid (VITAMIN C PO) Take by mouth.    Marland Kitchen. azelastine (ASTELIN) 0.1 % nasal spray Place 2 sprays into both nostrils 2 (two) times daily. For runny nose 30 mL 5  . Carbinoxamine Maleate (RYVENT) 6 MG TABS Take 6 mg by mouth 2 (two) times daily. 60 tablet 3  . Cholecalciferol  (VITAMIN D PO) Take by mouth.    Marland Kitchen. FLUoxetine (PROZAC) 40 MG capsule Take 40 mg by mouth daily.    . fluticasone (FLONASE) 50 MCG/ACT nasal spray Place 2 sprays into both nostrils daily. 16 g 5  . furosemide (LASIX) 20 MG tablet Take 20 mg by mouth.    . gabapentin (NEURONTIN) 300 MG capsule Take 300 mg by mouth 3 (three) times daily.    Marland Kitchen. losartan-hydrochlorothiazide (HYZAAR) 50-12.5 MG tablet Take 1 tablet by mouth daily. for blood pressure  1  . Melatonin 5 MG TABS     . montelukast (SINGULAIR) 10 MG tablet Take 1 tablet (10 mg total) by mouth at bedtime. 90 tablet 1  . Olopatadine HCl 0.2 % SOLN INSTILL 1 DROP INTO BOTH EYES DAILY. 2.5 mL 1  . pantoprazole (PROTONIX) 40 MG tablet Take 40 mg by mouth daily.  1  . pregabalin (LYRICA) 100 MG capsule Take 100 mg by mouth 2 (two) times daily.    . rosuvastatin (CRESTOR) 10 MG tablet TK 1 T PO D  0  . SYNJARDY 03-999 MG TABS TK 1 T PO BID WITH FOOD  2  . traZODone (DESYREL) 50 MG tablet Take 50 mg by mouth at bedtime.    . TRULICITY 0.75 MG/0.5ML SOPN INJECT 1 DOSE SQ Q WEEK  1  . Carbinoxamine Maleate ER [place](KARBINAL ER) 4 MG/5ML SUER Take 8 mg by mouth 2 (two) times a day for 30 days. 480 mL 5   No current facility-administered medications for this visit.    Allergies: Allergies  Allergen Reactions  . Meloxicam     Bruising   . Paxil [Paroxetine Hcl] Hives   I reviewed her past medical history, social history, family history, and environmental history and no significant changes have been reported from previous visit on 12/19/18.  Review of Systems  Constitutional: Negative for chills and fever.  HENT: Positive for congestion and rhinorrhea. Negative for sneezing, sore throat and trouble swallowing.   Eyes: Positive for itching. Negative for discharge and redness.  Respiratory: Negative for cough, chest tightness, shortness of breath and wheezing.   Cardiovascular: Negative.   Gastrointestinal: Negative.   Musculoskeletal: Negative for  myalgias.  Skin: Negative for rash.  Neurological: Negative for headaches.   Objective: Physical Exam Not obtained as encounter was done via telephone.   Previous notes and tests were reviewed.  I discussed the assessment and treatment plan with the patient. The patient was provided an opportunity to ask questions and all were answered. The patient agreed with the plan and demonstrated an understanding of the instructions.   The patient was advised to call back or seek an in-person evaluation if the symptoms worsen or if the condition fails to improve as anticipated.  I provided 31 minutes of non-face-to-face time during this encounter.  It was my pleasure to  participate in [place] [facility]'s care today. Please feel free to contact me with any questions or concerns.   Sincerely,   Larose Hires, MD

## 2019-03-27 NOTE — Patient Instructions (Addendum)
Non-allergic rhinitis   - skin and blood allergy testing was negative   - continue Karbinal take 8mg  (2ml) twice a day - this is an antihistamine.     -Use Astelin, nasal antihistamine, 2 sprays each nostril twice a day for control of a runny nose.  This appears to be covered by her insurance plan.   -Use Flonase 2 sprays each nostril daily as needed for nasal congestion.  This is the nasal steroid that appears to be covered by her insurance plan.   - for itchy/watery/red eyes use Olopatadine 1 drop each eye daily as needed   - nasal saline rinse prior to use of nasal sprays to help clean out the nose  Wheezing   - continue singulair 10mg  daily - take at bedtime   - have access to albuterol inhaler 2 puffs every 4-6 hours as needed for cough/wheeze/shortness of breath/chest tightness.  May use 15-20 minutes prior to activity.   Monitor frequency of use.    Control goals:   Full participation in all desired activities (may need albuterol before activity)  Albuterol use two time or less a week on average (not counting use with activity)  Cough interfering with sleep two time or less a month  Oral steroids no more than once a year  No hospitalizations   Follow-up 2-3 months or sooner if needed

## 2019-03-27 NOTE — Progress Notes (Signed)
Start time:  0958 Finish Time:  1026 Where are you located:  home Do you give [place] permission to bill your insurance:  yes Are you signed up for my chart:  yes  Pt is c/o runny nose, sneezing, itchy eyes.

## 2019-04-03 DIAGNOSIS — E1169 Type 2 diabetes mellitus with other specified complication: Secondary | ICD-10-CM | POA: Diagnosis not present

## 2019-04-03 DIAGNOSIS — M797 Fibromyalgia: Secondary | ICD-10-CM | POA: Diagnosis not present

## 2019-04-03 DIAGNOSIS — E785 Hyperlipidemia, unspecified: Secondary | ICD-10-CM | POA: Diagnosis not present

## 2019-04-03 DIAGNOSIS — F330 Major depressive disorder, recurrent, mild: Secondary | ICD-10-CM | POA: Diagnosis not present

## 2019-04-04 DIAGNOSIS — E785 Hyperlipidemia, unspecified: Secondary | ICD-10-CM | POA: Diagnosis not present

## 2019-04-04 DIAGNOSIS — M797 Fibromyalgia: Secondary | ICD-10-CM | POA: Diagnosis not present

## 2019-04-04 DIAGNOSIS — E1169 Type 2 diabetes mellitus with other specified complication: Secondary | ICD-10-CM | POA: Diagnosis not present

## 2019-04-04 DIAGNOSIS — F330 Major depressive disorder, recurrent, mild: Secondary | ICD-10-CM | POA: Diagnosis not present

## 2019-05-15 DIAGNOSIS — E785 Hyperlipidemia, unspecified: Secondary | ICD-10-CM | POA: Diagnosis not present

## 2019-05-15 DIAGNOSIS — F331 Major depressive disorder, recurrent, moderate: Secondary | ICD-10-CM | POA: Diagnosis not present

## 2019-05-15 DIAGNOSIS — K219 Gastro-esophageal reflux disease without esophagitis: Secondary | ICD-10-CM | POA: Diagnosis not present

## 2019-05-15 DIAGNOSIS — I10 Essential (primary) hypertension: Secondary | ICD-10-CM | POA: Diagnosis not present

## 2019-05-29 ENCOUNTER — Ambulatory Visit: Payer: Medicare HMO | Admitting: Allergy

## 2019-06-07 ENCOUNTER — Ambulatory Visit (INDEPENDENT_AMBULATORY_CARE_PROVIDER_SITE_OTHER): Payer: Medicare HMO | Admitting: Allergy

## 2019-06-07 ENCOUNTER — Telehealth: Payer: Self-pay

## 2019-06-07 ENCOUNTER — Encounter: Payer: Self-pay | Admitting: Allergy

## 2019-06-07 ENCOUNTER — Other Ambulatory Visit: Payer: Self-pay

## 2019-06-07 VITALS — BP 104/70 | HR 75 | Temp 97.7°F | Resp 16 | Ht 69.0 in

## 2019-06-07 DIAGNOSIS — H1089 Other conjunctivitis: Secondary | ICD-10-CM

## 2019-06-07 DIAGNOSIS — R6889 Other general symptoms and signs: Secondary | ICD-10-CM | POA: Diagnosis not present

## 2019-06-07 DIAGNOSIS — J310 Chronic rhinitis: Secondary | ICD-10-CM

## 2019-06-07 DIAGNOSIS — R062 Wheezing: Secondary | ICD-10-CM | POA: Diagnosis not present

## 2019-06-07 MED ORDER — IPRATROPIUM BROMIDE 0.06 % NA SOLN: 2.0000 | Freq: Four times a day (QID) | NASAL | 5 refills | Status: AC

## 2019-06-07 NOTE — Patient Instructions (Addendum)
Non-allergic rhinitis   - skin and blood allergy testing was negative   - Astelin has not been effective in controlling nasal drainage thus will stop   - try nasal Atrovent, 2 sprays each nostril up to 3-4 times a day as needed for nasal drainage   - for itchy/watery/red eyes use Olopatadine 1 drop each eye daily as needed.  Try using if needed after using nasal Atrovent to help decrease drainage   - nasal saline rinse prior to use of nasal sprays to help clean out the nose   - will place referral for ENT (Ears, nose and throat) to evaluation/assessment of your nasal persistent nasal drainage  Wheezing   - continue singulair 10mg  daily - take at bedtime   - have access to albuterol inhaler 2 puffs every 4-6 hours as needed for cough/wheeze/shortness of breath/chest tightness.  May use 15-20 minutes prior to activity.   Monitor frequency of use.    Control goals:   Full participation in all desired activities (may need albuterol before activity)  Albuterol use two time or less a week on average (not counting use with activity)  Cough interfering with sleep two time or less a month  Oral steroids no more than once a year  No hospitalizations   Follow-up 3-4 months or sooner if needed

## 2019-06-07 NOTE — Progress Notes (Signed)
Follow-up Note  RE: Cyncere Ruhe MRN: [id number] DOB: 10/15/1966 Date of Office Visit: 06/07/2019   History of present illness: Virginia Garcia is a 53 y.o. female presenting today for follow-up of non-allergic rhinitis and wheezing.  She has a telemedicine visit on 03/27/19 with myself.  She states her nasal drainage is unchanged and does not feel the astelin has made any differences.  She also states when she uses the olopatadine eye drop it does relieve her itchy/watery eyes however it seems to drain and she will feel a "lump" in her throat that she has to drink a lot of water to move out.  She states the [place] was too expensive.  She is just taking montelukast.  She states she only had 1 episode of wheezing since last visit with albuterol use.  No nighttime awakenings.    Review of systems: Review of Systems  Constitutional: Negative for chills, fever and malaise/fatigue.  HENT: Positive for sore throat. Negative for congestion, ear discharge and nosebleeds.   Eyes: Negative for pain, discharge and redness.  Respiratory: Positive for wheezing. Negative for cough, sputum production and shortness of breath.   Cardiovascular: Negative for chest pain.  Gastrointestinal: Negative for abdominal pain, constipation, diarrhea, heartburn, nausea and vomiting.  Musculoskeletal: Negative for joint pain.  Skin: Negative for itching and rash.  Neurological: Negative for headaches.    All other systems negative unless noted above in HPI  Past medical/social/surgical/family history have been reviewed and are unchanged unless specifically indicated below.  No changes  Medication List: Allergies as of 06/07/2019      Reactions   Meloxicam    Bruising    Paxil [paroxetine Hcl] Hives      Medication List       Accurate as of June 07, 2019 12:29 PM. If you have any questions, ask your nurse or doctor.        albuterol 108 (90 Base) MCG/ACT inhaler Commonly known as: Ventolin HFA Inhale  2 puffs into the lungs every 4 (four) hours as needed for wheezing or shortness of breath.   azelastine 0.1 % nasal spray Commonly known as: ASTELIN Place 2 sprays into both nostrils 2 (two) times daily. For runny nose   Carbinoxamine Maleate ER 4 MG/5ML Suer Commonly known as: [employer] ER Take 8 mg by mouth 2 (two) times a day for 30 days.   FLUoxetine 40 MG capsule Commonly known as: PROZAC Take 40 mg by mouth daily.   fluticasone 50 MCG/ACT nasal spray Commonly known as: FLONASE Place 2 sprays into both nostrils daily.   furosemide 20 MG tablet Commonly known as: LASIX Take 20 mg by mouth.   gabapentin 300 MG capsule Commonly known as: NEURONTIN Take 300 mg by mouth 3 (three) times daily.   ipratropium 0.06 % nasal spray Commonly known as: Atrovent Place 2 sprays into both nostrils 4 (four) times daily. Started by:  Charmian Muff, MD   losartan-hydrochlorothiazide 50-12.5 MG tablet Commonly known as: HYZAAR Take 1 tablet by mouth daily. for blood pressure   Melatonin 5 MG Tabs   montelukast 10 MG tablet Commonly known as: SINGULAIR Take 1 tablet (10 mg total) by mouth at bedtime.   Olopatadine HCl 0.2 % Soln INSTILL 1 DROP INTO BOTH EYES DAILY.   pantoprazole 40 MG tablet Commonly known as: PROTONIX Take 40 mg by mouth daily.   pregabalin 100 MG capsule Commonly known as: LYRICA Take 100 mg by mouth 2 (two) times daily.   rosuvastatin  10 MG tablet Commonly known as: CRESTOR TK 1 T PO D   Synjardy 03-999 MG Tabs Generic drug: Empagliflozin-metFORMIN HCl TK 1 T PO BID WITH FOOD   traZODone 50 MG tablet Commonly known as: DESYREL Take 50 mg by mouth at bedtime.   Trulicity 0.75 MG/0.5ML Sopn Generic drug: Dulaglutide INJECT 1 DOSE SQ Q WEEK   VITAMIN C PO Take by mouth.   VITAMIN D PO Take by mouth.       Known medication allergies: Allergies  Allergen Reactions  . Meloxicam     Bruising   . Paxil [Paroxetine Hcl] Hives      Physical examination: Blood pressure 104/70, pulse 75, temperature 97.7 F (36.5 C), temperature source Temporal, resp. rate 16, height 5\' 9"  (1.753 m), SpO2 96 %.  General: Alert, interactive, in no acute distress. HEENT: PERRLA, TMs pearly gray, turbinates mildly edematous with clear discharge, post-pharynx non erythematous. Neck: Supple without lymphadenopathy. Lungs: Clear to auscultation without wheezing, rhonchi or rales. {no increased work of breathing. CV: Normal S1, S2 without murmurs. Abdomen: Nondistended, nontender. Skin: Warm and dry, without lesions or rashes. Extremities:  No clubbing, cyanosis or edema. Neuro:   Grossly intact.  Diagnositics/Labs: None today  Assessment and plan:   Non-allergic rhinitis   - skin and blood allergy testing was negative   - Astelin has not been effective in controlling nasal drainage thus will stop   - try nasal Atrovent, 2 sprays each nostril up to 3-4 times a day as needed for nasal drainage   - for itchy/watery/red eyes use Olopatadine 1 drop each eye daily as needed.  Try using if needed after using nasal Atrovent to help decrease drainage   - nasal saline rinse prior to use of nasal sprays to help clean out the nose   - will place referral for ENT (Ears, nose and throat) to evaluation/assessment of your nasal persistent nasal drainage  Wheezing   - continue singulair 10mg  daily - take at bedtime   - have access to albuterol inhaler 2 puffs every 4-6 hours as needed for cough/wheeze/shortness of breath/chest tightness.  May use 15-20 minutes prior to activity.   Monitor frequency of use.    Control goals:   Full participation in all desired activities (may need albuterol before activity)  Albuterol use two time or less a week on average (not counting use with activity)  Cough interfering with sleep two time or less a month  Oral steroids no more than once a year  No hospitalizations   Follow-up 3-4 months or sooner if  needed  I appreciate the opportunity to take part in [place]'s care. Please do not hesitate to contact me with questions.  Sincerely,   Margo AyeShaylar , MD Allergy/Immunology [facility]

## 2019-06-07 NOTE — Telephone Encounter (Signed)
Ambulatory referral ENT

## 2019-06-11 ENCOUNTER — Telehealth: Payer: Self-pay

## 2019-06-11 NOTE — Telephone Encounter (Signed)
-----   Message from [place], MD sent at 06/07/2019 12:30 PM EDT ----- ENT referral for persistent rhinorrhea with Non-allergic rhinitis not responsive to nasal antihistamines

## 2019-06-11 NOTE — Telephone Encounter (Signed)
Referral has been placed to Dr Teohs office.  Thanks  

## 2019-07-09 DIAGNOSIS — J343 Hypertrophy of nasal turbinates: Secondary | ICD-10-CM | POA: Diagnosis not present

## 2019-07-09 DIAGNOSIS — J342 Deviated nasal septum: Secondary | ICD-10-CM | POA: Diagnosis not present

## 2019-07-09 DIAGNOSIS — R6889 Other general symptoms and signs: Secondary | ICD-10-CM | POA: Diagnosis not present

## 2019-07-09 DIAGNOSIS — J310 Chronic rhinitis: Secondary | ICD-10-CM | POA: Diagnosis not present

## 2019-07-17 DIAGNOSIS — M25561 Pain in right knee: Secondary | ICD-10-CM | POA: Diagnosis not present

## 2019-07-17 DIAGNOSIS — R6889 Other general symptoms and signs: Secondary | ICD-10-CM | POA: Diagnosis not present

## 2019-07-17 DIAGNOSIS — F17210 Nicotine dependence, cigarettes, uncomplicated: Secondary | ICD-10-CM | POA: Diagnosis not present

## 2019-07-17 DIAGNOSIS — I10 Essential (primary) hypertension: Secondary | ICD-10-CM | POA: Diagnosis not present

## 2019-07-17 DIAGNOSIS — M797 Fibromyalgia: Secondary | ICD-10-CM | POA: Diagnosis not present

## 2019-07-17 DIAGNOSIS — K219 Gastro-esophageal reflux disease without esophagitis: Secondary | ICD-10-CM | POA: Diagnosis not present

## 2019-07-17 DIAGNOSIS — E559 Vitamin D deficiency, unspecified: Secondary | ICD-10-CM | POA: Diagnosis not present

## 2019-07-17 DIAGNOSIS — F331 Major depressive disorder, recurrent, moderate: Secondary | ICD-10-CM | POA: Diagnosis not present

## 2019-07-17 DIAGNOSIS — E1169 Type 2 diabetes mellitus with other specified complication: Secondary | ICD-10-CM | POA: Diagnosis not present

## 2019-07-17 DIAGNOSIS — E785 Hyperlipidemia, unspecified: Secondary | ICD-10-CM | POA: Diagnosis not present

## 2019-07-18 ENCOUNTER — Other Ambulatory Visit: Payer: Self-pay | Admitting: Nurse Practitioner

## 2019-07-18 ENCOUNTER — Ambulatory Visit
Admission: RE | Admit: 2019-07-18 | Discharge: 2019-07-18 | Disposition: A | Discharge status: Home / Self Care | Payer: Medicare HMO | Source: Ambulatory Visit | Attending: Nurse Practitioner | Admitting: Nurse Practitioner

## 2019-07-18 DIAGNOSIS — M170 Bilateral primary osteoarthritis of knee: Secondary | ICD-10-CM | POA: Diagnosis not present

## 2019-07-18 DIAGNOSIS — M25561 Pain in right knee: Secondary | ICD-10-CM

## 2019-07-18 DIAGNOSIS — M25562 Pain in left knee: Secondary | ICD-10-CM

## 2019-07-30 DIAGNOSIS — J310 Chronic rhinitis: Secondary | ICD-10-CM | POA: Diagnosis not present

## 2019-07-30 DIAGNOSIS — R6889 Other general symptoms and signs: Secondary | ICD-10-CM | POA: Diagnosis not present

## 2019-07-30 DIAGNOSIS — R0982 Postnasal drip: Secondary | ICD-10-CM | POA: Diagnosis not present

## 2019-07-30 DIAGNOSIS — J343 Hypertrophy of nasal turbinates: Secondary | ICD-10-CM | POA: Diagnosis not present

## 2019-08-12 DIAGNOSIS — H0102A Squamous blepharitis right eye, upper and lower eyelids: Secondary | ICD-10-CM | POA: Diagnosis not present

## 2019-08-12 DIAGNOSIS — H1045 Other chronic allergic conjunctivitis: Secondary | ICD-10-CM | POA: Diagnosis not present

## 2019-08-12 DIAGNOSIS — H04123 Dry eye syndrome of bilateral lacrimal glands: Secondary | ICD-10-CM | POA: Diagnosis not present

## 2019-08-12 DIAGNOSIS — E119 Type 2 diabetes mellitus without complications: Secondary | ICD-10-CM | POA: Diagnosis not present

## 2019-08-12 DIAGNOSIS — R6889 Other general symptoms and signs: Secondary | ICD-10-CM | POA: Diagnosis not present

## 2019-08-12 DIAGNOSIS — H11153 Pinguecula, bilateral: Secondary | ICD-10-CM | POA: Diagnosis not present

## 2019-08-12 DIAGNOSIS — H0102B Squamous blepharitis left eye, upper and lower eyelids: Secondary | ICD-10-CM | POA: Diagnosis not present

## 2019-08-14 DIAGNOSIS — Z803 Family history of malignant neoplasm of breast: Secondary | ICD-10-CM | POA: Diagnosis not present

## 2019-08-14 DIAGNOSIS — Z1231 Encounter for screening mammogram for malignant neoplasm of breast: Secondary | ICD-10-CM | POA: Diagnosis not present

## 2019-08-14 DIAGNOSIS — R6889 Other general symptoms and signs: Secondary | ICD-10-CM | POA: Diagnosis not present

## 2019-10-09 ENCOUNTER — Ambulatory Visit: Payer: Medicare HMO | Admitting: Allergy

## 2019-10-15 ENCOUNTER — Other Ambulatory Visit: Payer: Self-pay | Admitting: Nurse Practitioner

## 2019-10-15 ENCOUNTER — Other Ambulatory Visit (HOSPITAL_COMMUNITY)
Admission: RE | Admit: 2019-10-15 | Discharge: 2019-10-15 | Disposition: A | Discharge status: Home / Self Care | Payer: Medicare HMO | Source: Ambulatory Visit | Attending: Nurse Practitioner | Admitting: Nurse Practitioner

## 2019-10-15 DIAGNOSIS — F331 Major depressive disorder, recurrent, moderate: Secondary | ICD-10-CM | POA: Diagnosis not present

## 2019-10-15 DIAGNOSIS — K219 Gastro-esophageal reflux disease without esophagitis: Secondary | ICD-10-CM | POA: Diagnosis not present

## 2019-10-15 DIAGNOSIS — M797 Fibromyalgia: Secondary | ICD-10-CM | POA: Diagnosis not present

## 2019-10-15 DIAGNOSIS — L292 Pruritus vulvae: Secondary | ICD-10-CM | POA: Diagnosis not present

## 2019-10-15 DIAGNOSIS — R6889 Other general symptoms and signs: Secondary | ICD-10-CM | POA: Diagnosis not present

## 2019-10-15 DIAGNOSIS — N898 Other specified noninflammatory disorders of vagina: Secondary | ICD-10-CM | POA: Diagnosis not present

## 2019-10-15 DIAGNOSIS — R10812 Left upper quadrant abdominal tenderness: Secondary | ICD-10-CM | POA: Diagnosis not present

## 2019-10-15 DIAGNOSIS — I10 Essential (primary) hypertension: Secondary | ICD-10-CM | POA: Diagnosis not present

## 2019-10-15 DIAGNOSIS — E1169 Type 2 diabetes mellitus with other specified complication: Secondary | ICD-10-CM | POA: Diagnosis not present

## 2019-10-17 ENCOUNTER — Other Ambulatory Visit: Payer: Self-pay | Admitting: Nurse Practitioner

## 2019-10-17 DIAGNOSIS — R10812 Left upper quadrant abdominal tenderness: Secondary | ICD-10-CM

## 2019-10-17 LAB — MOLECULAR ANCILLARY ONLY
Bacterial Vaginitis (gardnerella): NEGATIVE
Candida Glabrata: NEGATIVE
Candida Vaginitis: NEGATIVE
Chlamydia: NEGATIVE
Comment: NEGATIVE
Comment: NEGATIVE
Comment: NEGATIVE
Comment: NEGATIVE
Comment: NEGATIVE
Comment: NORMAL
Neisseria Gonorrhea: NEGATIVE
Trichomonas: NEGATIVE

## 2019-10-29 ENCOUNTER — Other Ambulatory Visit: Payer: Self-pay | Admitting: Allergy

## 2019-11-04 ENCOUNTER — Ambulatory Visit
Admission: RE | Admit: 2019-11-04 | Discharge: 2019-11-04 | Disposition: A | Discharge status: Home / Self Care | Payer: Medicare HMO | Source: Ambulatory Visit | Attending: Nurse Practitioner | Admitting: Nurse Practitioner

## 2019-11-04 DIAGNOSIS — R10812 Left upper quadrant abdominal tenderness: Secondary | ICD-10-CM

## 2019-11-04 DIAGNOSIS — R109 Unspecified abdominal pain: Secondary | ICD-10-CM | POA: Diagnosis not present

## 2019-11-04 DIAGNOSIS — R6889 Other general symptoms and signs: Secondary | ICD-10-CM | POA: Diagnosis not present

## 2019-11-07 ENCOUNTER — Other Ambulatory Visit: Payer: Self-pay

## 2019-11-07 ENCOUNTER — Encounter: Payer: Self-pay | Admitting: Allergy

## 2019-11-07 ENCOUNTER — Ambulatory Visit (INDEPENDENT_AMBULATORY_CARE_PROVIDER_SITE_OTHER): Payer: Medicare HMO | Admitting: Allergy

## 2019-11-07 VITALS — BP 120/78 | HR 87 | Temp 97.3°F | Resp 16 | Ht 68.0 in | Wt 271.2 lb

## 2019-11-07 DIAGNOSIS — R062 Wheezing: Secondary | ICD-10-CM

## 2019-11-07 DIAGNOSIS — H1089 Other conjunctivitis: Secondary | ICD-10-CM

## 2019-11-07 DIAGNOSIS — J310 Chronic rhinitis: Secondary | ICD-10-CM | POA: Diagnosis not present

## 2019-11-07 DIAGNOSIS — R6889 Other general symptoms and signs: Secondary | ICD-10-CM | POA: Diagnosis not present

## 2019-11-07 NOTE — Progress Notes (Signed)
Follow-up Note  RE: Virginia Garcia MRN: [id number] DOB: 03-Aug-1966 Date of Office Visit: 11/07/2019   History of present illness: Virginia Garcia is a 53 y.o. female presenting today for follow-up of nonallergic rhinitis with significant nasal drainage.  She was last seen in the office on June 07, 2019 by myself.  I did refer her to ENT after last visit as she had not made any significant improvement despite adequate medication management.  She has had negative skin and serum IgE environmental testing.  She did have a nasal endoscopy performed by Dr. Jodean Lima that revealed congested and edematous mucosa over anterior aspect of the inferior turbinates and nasal septum.  He recommended that she continue use of nasal Atrovent and can add in Flonase as well as continue to perform nasal saline rinses.  It appears discussion of prednisone taper was had however patient declined due to history of diabetes. She continues to use nasal Atrovent which she states does help for only about 4 to 6 hours or so.  She would like to not have to this issue to begin with.  She is taking Singulair daily which does help control wheezing episodes.  She states she has an episode about once every 4 months or so where she will use her albuterol with relief.  She has not required any systemic steroids, ED or urgent care visits for this.  Review of systems: Review of Systems  Constitutional: Negative.   HENT: Negative.   Eyes: Negative.   Respiratory: Negative.   Gastrointestinal: Negative.   Musculoskeletal: Negative.   Skin: Negative.   Neurological: Negative.     All other systems negative unless noted above in HPI  Past medical/social/surgical/family history have been reviewed and are unchanged unless specifically indicated below.  No changes  Medication List: Current Outpatient Medications  Medication Sig Dispense Refill  . albuterol (VENTOLIN HFA) 108 (90 Base) MCG/ACT inhaler Inhale 2 puffs into the lungs every 4  (four) hours as needed for wheezing or shortness of breath. 1 Inhaler 4  . Ascorbic Acid (VITAMIN C PO) Take by mouth.    Marland Kitchen atorvastatin (LIPITOR) 80 MG tablet     . Azelastine HCl 137 MCG/SPRAY SOLN USE 2 SPRAYS INTO EACH NOSTRIL TWICE A DAY FOR RUNNY NOSE 30 mL 0  . Carbinoxamine Maleate ER [facility] ER) 4 MG/5ML SUER Take 8 mg by mouth 2 (two) times a day for 30 days. 480 mL 5  . Cholecalciferol (VITAMIN D PO) Take by mouth.    . fluticasone (FLONASE) 50 MCG/ACT nasal spray SPRAY 2 SPRAYS INTO EACH NOSTRIL EVERY DAY 48 mL 0  . furosemide (LASIX) 20 MG tablet Take 20 mg by mouth.    Marland Kitchen ipratropium (ATROVENT) 0.06 % nasal spray Place 2 sprays into both nostrils 4 (four) times daily. 15 mL 5  . JARDIANCE 10 MG TABS tablet     . losartan-hydrochlorothiazide (HYZAAR) 50-12.5 MG tablet Take 1 tablet by mouth daily. for blood pressure  1  . Melatonin 5 MG TABS     . montelukast (SINGULAIR) 10 MG tablet Take 1 tablet (10 mg total) by mouth at bedtime. 90 tablet 1  . Olopatadine HCl 0.2 % SOLN INSTILL 1 DROP INTO BOTH EYES DAILY. 2.5 mL 1  . pantoprazole (PROTONIX) 40 MG tablet Take 40 mg by mouth daily.  1  . pioglitazone (ACTOS) 15 MG tablet     . SHINGRIX injection     . traZODone (DESYREL) 50 MG tablet Take 50 mg  by mouth at bedtime.    . TRULICITY 6.23 JS/2.8BT SOPN INJECT 1 DOSE SQ Q WEEK  1   No current facility-administered medications for this visit.     Known medication allergies: Allergies  Allergen Reactions  . Meloxicam     Bruising   . Paxil [Paroxetine Hcl] Hives     Physical examination: Blood pressure 120/78, pulse 87, temperature (!) 97.3 F (36.3 C), temperature source Temporal, resp. rate 16, height 5\' 8"  (1.727 m), weight 271 lb 3.2 oz (123 kg), SpO2 96 %.  General: Alert, interactive, in no acute distress. HEENT: PERRLA, TMs pearly gray, turbinates mildly edematous with clear discharge, post-pharynx non erythematous. Neck: Supple without  lymphadenopathy. Lungs: Clear to auscultation without wheezing, rhonchi or rales. {no increased work of breathing. CV: Normal S1, S2 without murmurs. Abdomen: Nondistended, nontender. Skin: Warm and dry, without lesions or rashes. Extremities:  No clubbing, cyanosis or edema. Neuro:   Grossly intact.  Diagnositics/Labs: None today  Assessment and plan:   Non-allergic rhinitis   - skin and blood allergy testing was negative   - Astelin has not been effective in controlling nasal drainage thus have stop   - continue Atrovent, 2 sprays each nostril up to 3-4 times a day as needed for nasal drainage   - for itchy/watery/red eyes use Olopatadine 1 drop each eye daily as needed.    - nasal saline rinse prior to use of nasal sprays to help clean out the nose  Wheezing   - continue singulair 10mg  daily - take at bedtime   - have access to albuterol inhaler 2 puffs every 4-6 hours as needed for cough/wheeze/shortness of breath/chest tightness.  May use 15-20 minutes prior to activity.   Monitor frequency of use.    Control goals:   Full participation in all desired activities (may need albuterol before activity)  Albuterol use two time or less a week on average (not counting use with activity)  Cough interfering with sleep two time or less a month  Oral steroids no more than once a year  No hospitalizations   Follow-up 6 months or sooner if needed  I appreciate the opportunity to take part in [name]'s care. Please do not hesitate to contact me with questions.  Sincerely,   Prudy Feeler, MD Allergy/Immunology [facility]

## 2019-11-07 NOTE — Patient Instructions (Addendum)
Non-allergic rhinitis   - skin and blood allergy testing was negative  - Astelin has not been effective in controlling nasal drainage thus have stop   - continue Atrovent, 2 sprays each nostril up to 3-4 times a day as needed for nasal drainage   - for itchy/watery/red eyes use Olopatadine 1 drop each eye daily as needed.    - nasal saline rinse prior to use of nasal sprays to help clean out the nose  Wheezing   - continue singulair 10mg  daily - take at bedtime   - have access to albuterol inhaler 2 puffs every 4-6 hours as needed for cough/wheeze/shortness of breath/chest tightness.  May use 15-20 minutes prior to activity.   Monitor frequency of use.    Control goals:   Full participation in all desired activities (may need albuterol before activity)  Albuterol use two time or less a week on average (not counting use with activity)  Cough interfering with sleep two time or less a month  Oral steroids no more than once a year  No hospitalizations   Follow-up 6 months or sooner if needed

## 2019-11-11 DIAGNOSIS — R6889 Other general symptoms and signs: Secondary | ICD-10-CM | POA: Diagnosis not present

## 2019-11-11 DIAGNOSIS — F331 Major depressive disorder, recurrent, moderate: Secondary | ICD-10-CM | POA: Diagnosis not present

## 2019-11-11 DIAGNOSIS — A6004 Herpesviral vulvovaginitis: Secondary | ICD-10-CM | POA: Diagnosis not present

## 2019-11-11 DIAGNOSIS — E785 Hyperlipidemia, unspecified: Secondary | ICD-10-CM | POA: Diagnosis not present

## 2019-11-11 DIAGNOSIS — L292 Pruritus vulvae: Secondary | ICD-10-CM | POA: Diagnosis not present

## 2019-11-20 ENCOUNTER — Other Ambulatory Visit: Payer: Self-pay | Admitting: Allergy

## 2020-05-06 ENCOUNTER — Ambulatory Visit: Payer: Medicare HMO | Admitting: Allergy

## 2020-05-14 ENCOUNTER — Other Ambulatory Visit: Payer: Self-pay

## 2020-05-18 ENCOUNTER — Other Ambulatory Visit: Payer: Self-pay | Admitting: Allergy

## 2020-05-22 IMAGING — CR DG KNEE COMPLETE 4+V*L*
4 series · 4 of 4 positions shown · non-contrast
Comparison: None.

CLINICAL DATA: Knee pain, locking, no trauma

EXAM:
RIGHT KNEE - COMPLETE 4+ VIEW; LEFT KNEE - COMPLETE 4+ VIEW

[t knee ap left]
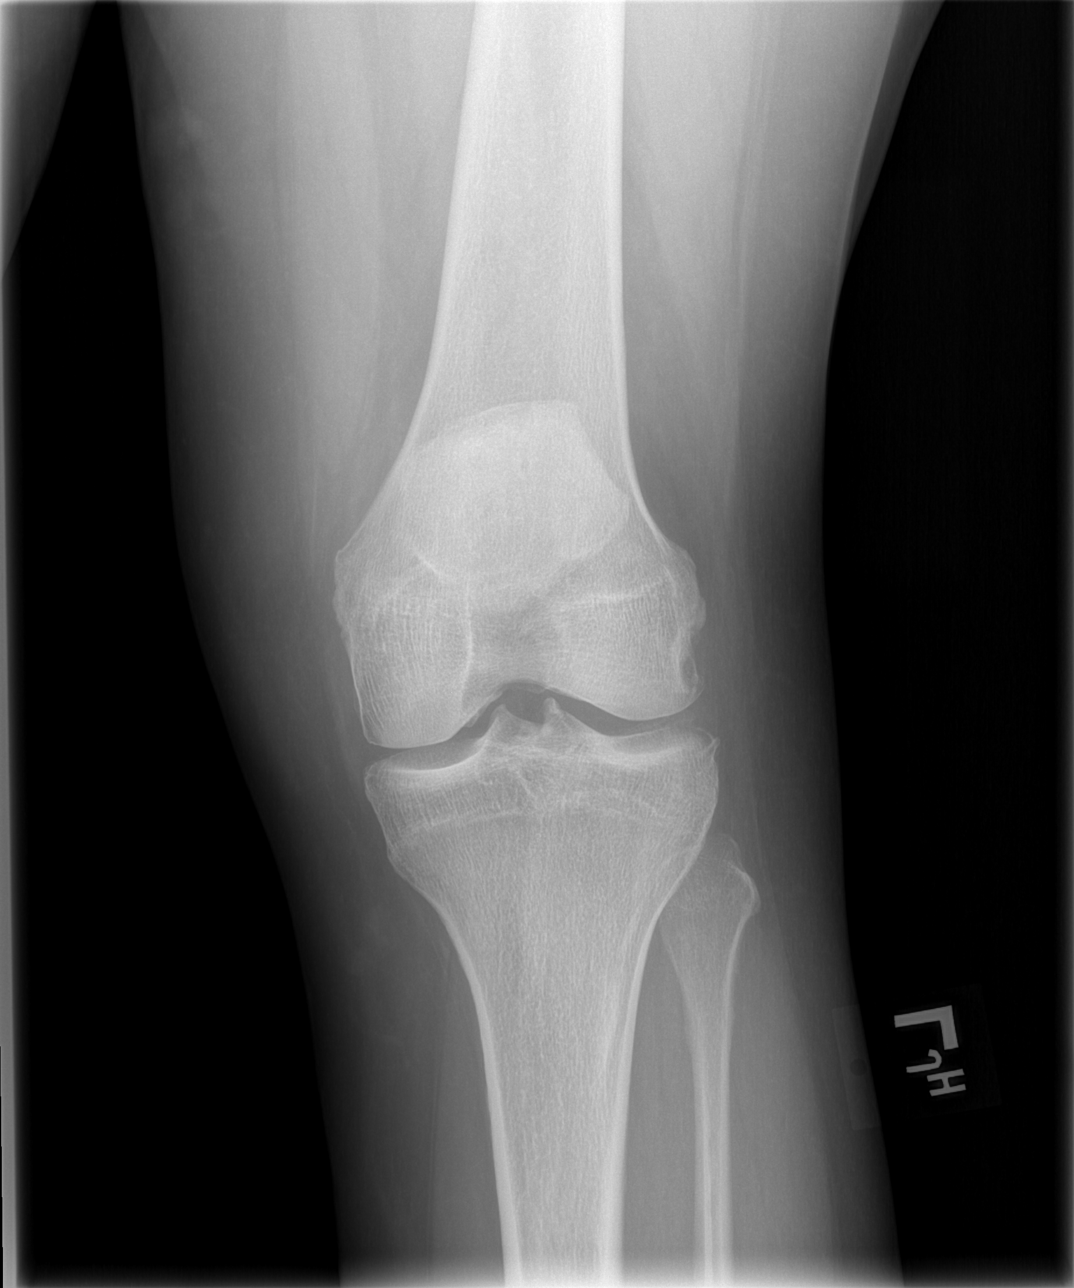

[t knee oblique left (1 of 2)]
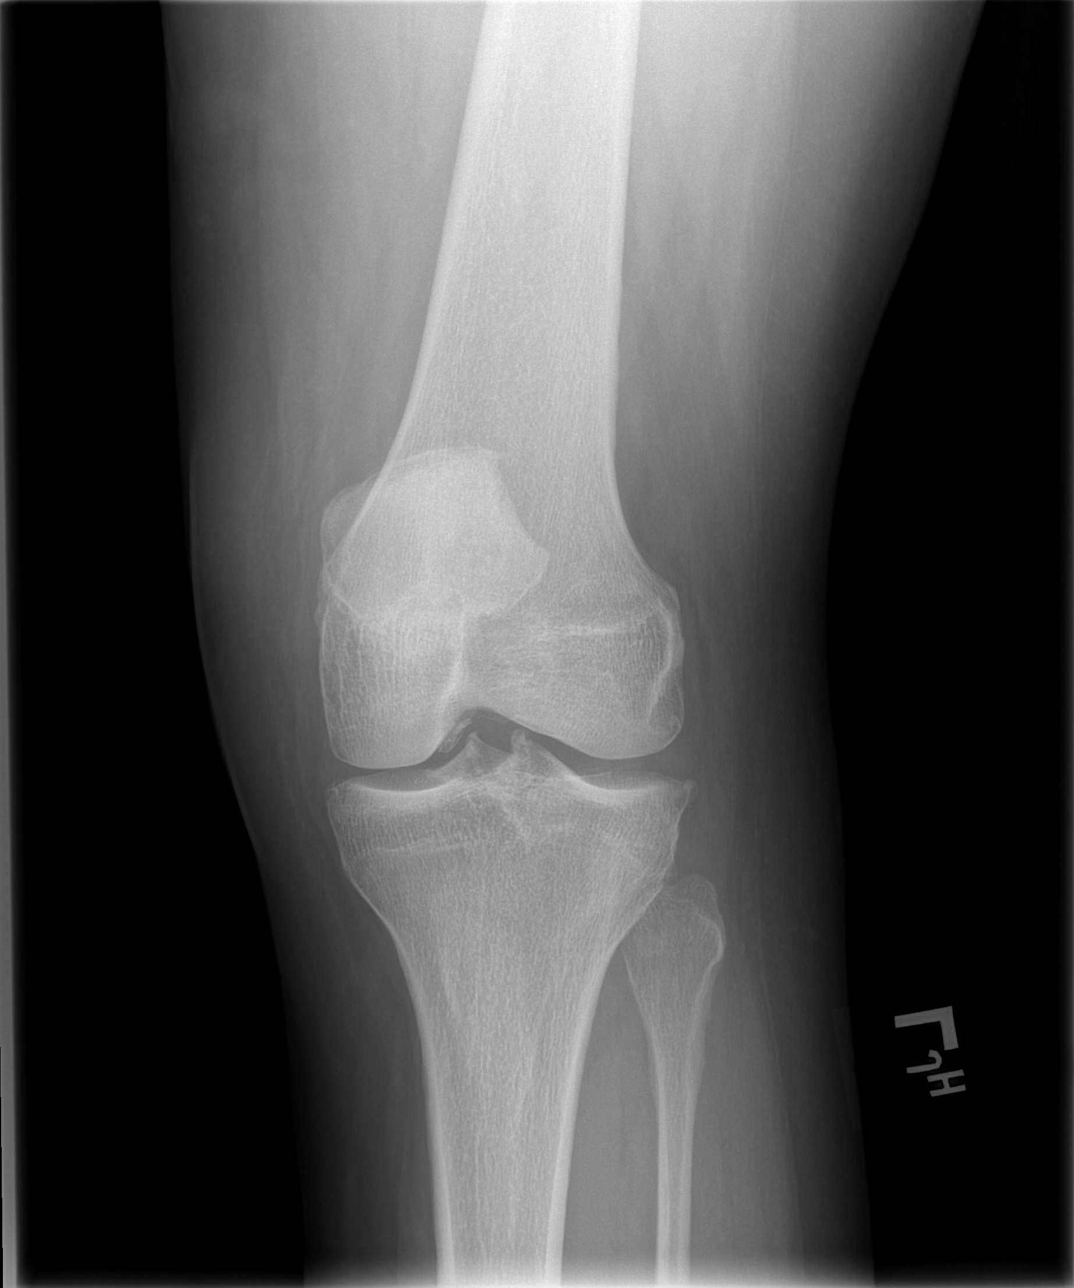

[t knee oblique left (2 of 2)]
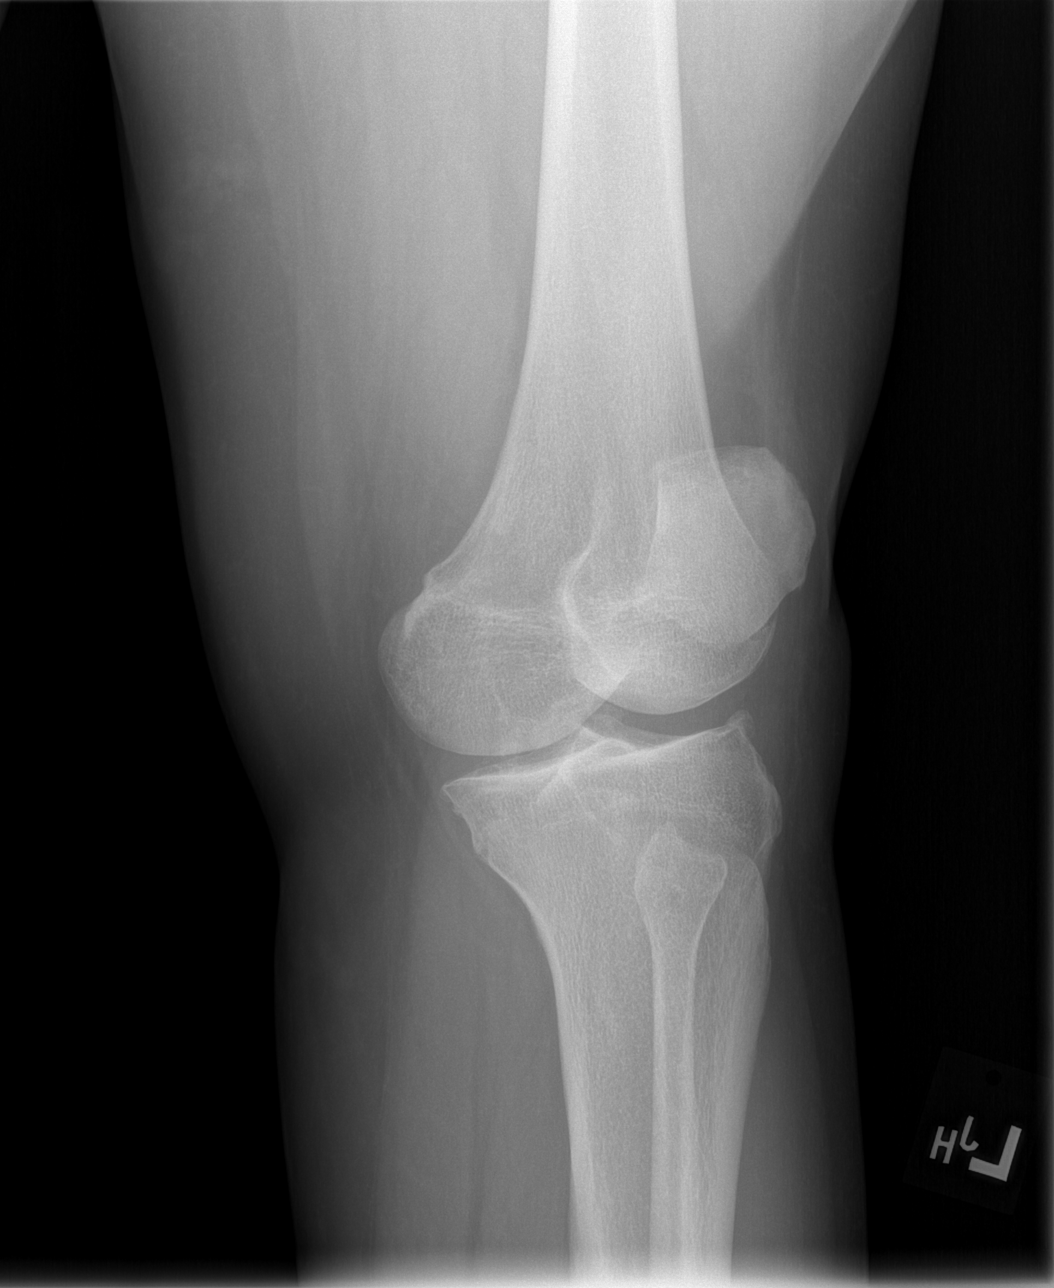

[t knee lat left]
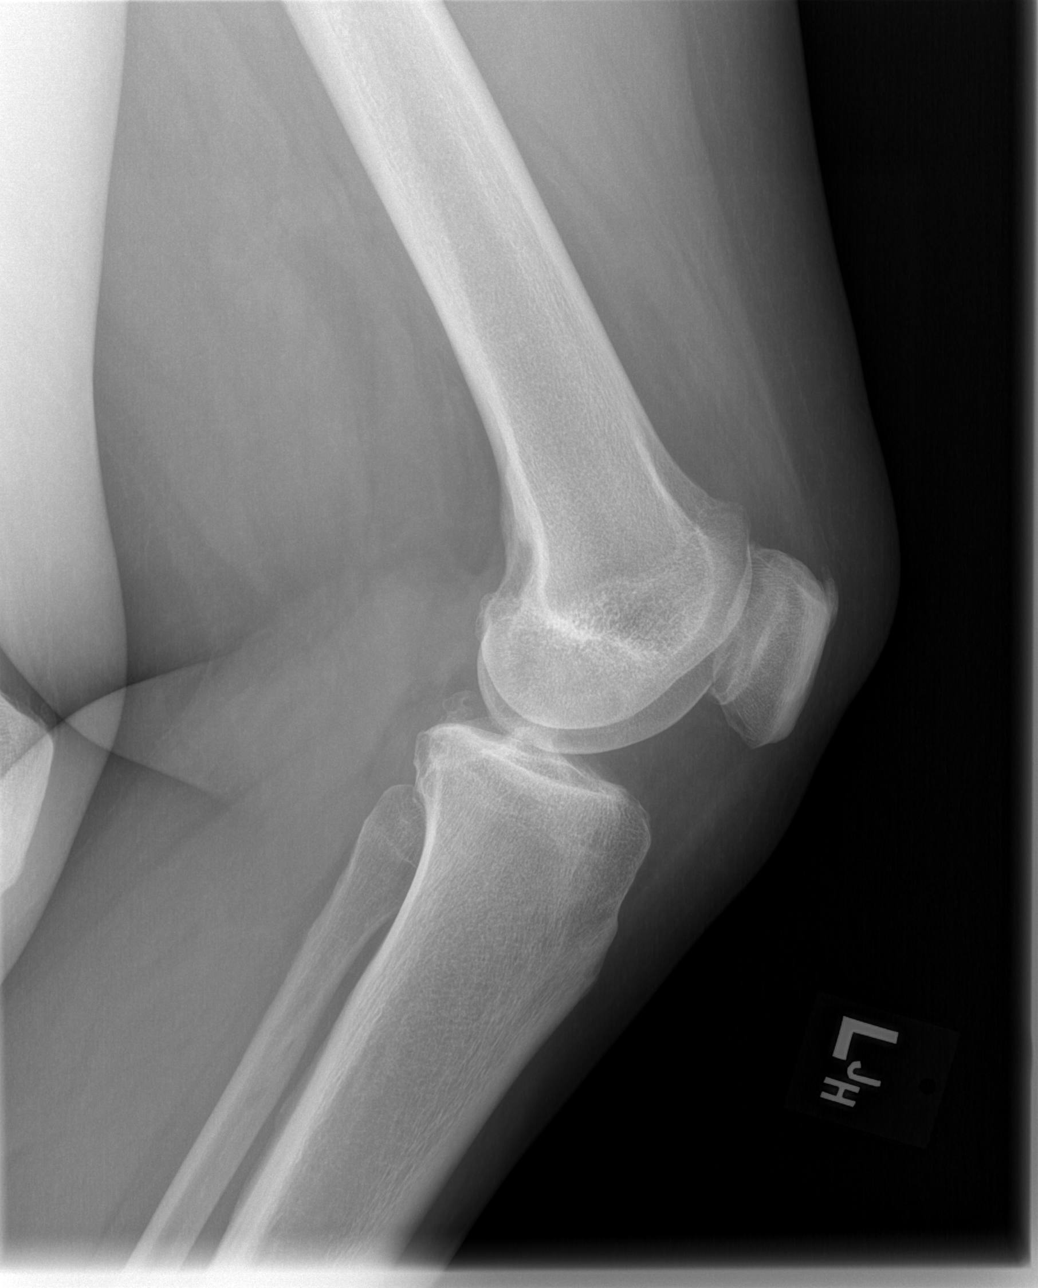

[4 of 4 positions shown; findings below may reference images not displayed]

FINDINGS: No fracture or dislocation of the bilateral knees. There is
generally mild and symmetric bilateral tricompartmental joint space
narrowing, moderate in the patellofemoral compartments bilaterally.
No knee joint effusion. There is an incidental small exostosis of
the medial right tibial metadiaphysis. Soft tissues are
unremarkable.
IMPRESSION: No fracture or dislocation of the bilateral knees. There is
generally mild and symmetric bilateral tricompartmental joint space
narrowing, moderate in the patellofemoral compartments bilaterally.
No knee joint effusion.

## 2020-05-31 ENCOUNTER — Other Ambulatory Visit: Payer: Self-pay | Admitting: Allergy

## 2020-07-20 ENCOUNTER — Other Ambulatory Visit: Payer: Self-pay | Admitting: Allergy

## 2020-09-08 IMAGING — US US ABDOMEN COMPLETE
1 series · 14 of 25 positions shown · non-contrast
Comparison: None.

CLINICAL DATA: Left-sided abdominal pain for 2 years

EXAM:
ABDOMEN ULTRASOUND COMPLETE

[Series 1: us abdomen complete · 0.24mm/px · 14 of 103 slices shown]
[im 1/103]
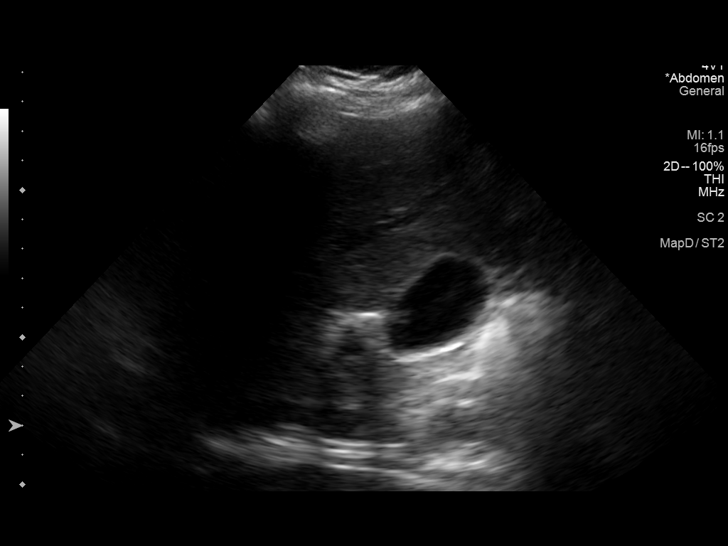
[im 9/103]
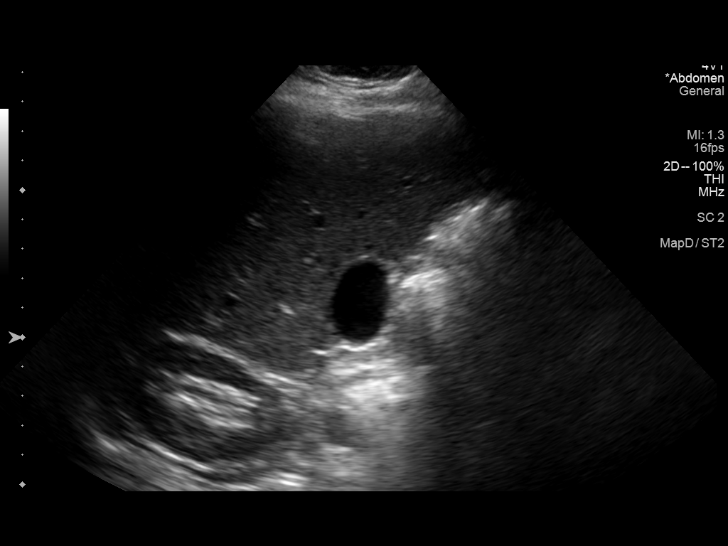
[im 18/103]
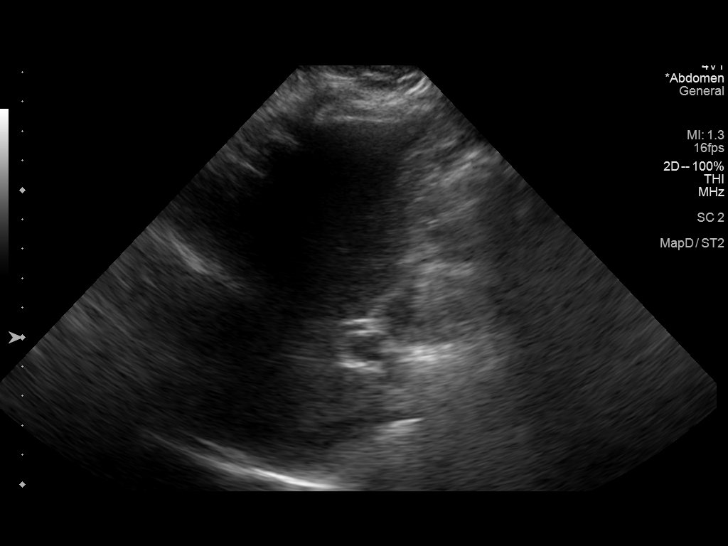
[im 26/103]
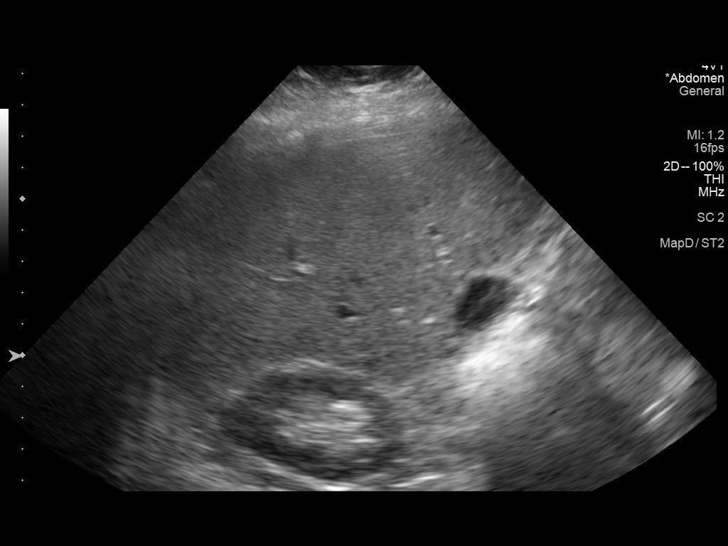
[im 35/103]
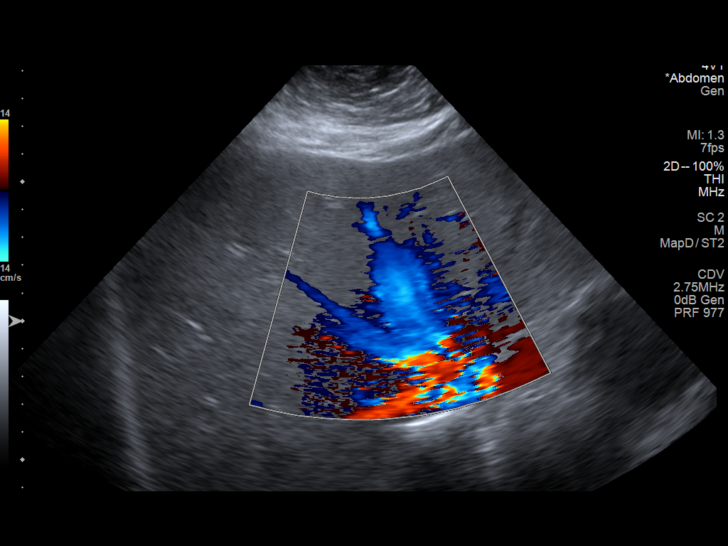
[im 39/103]
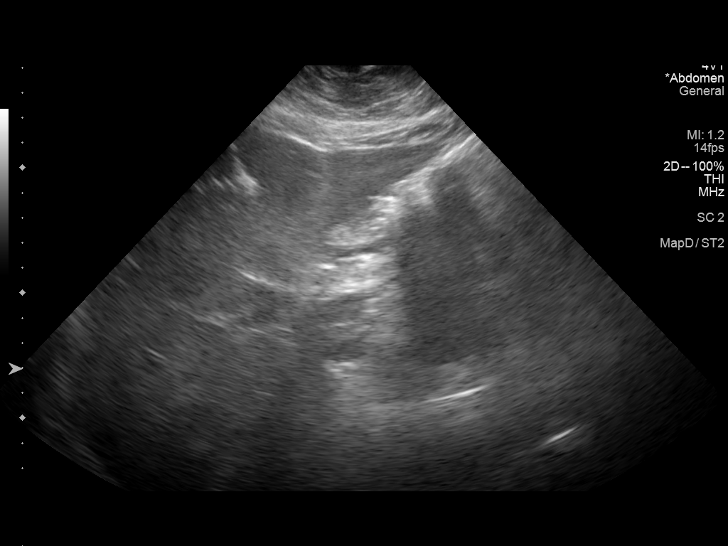
[im 47/103]
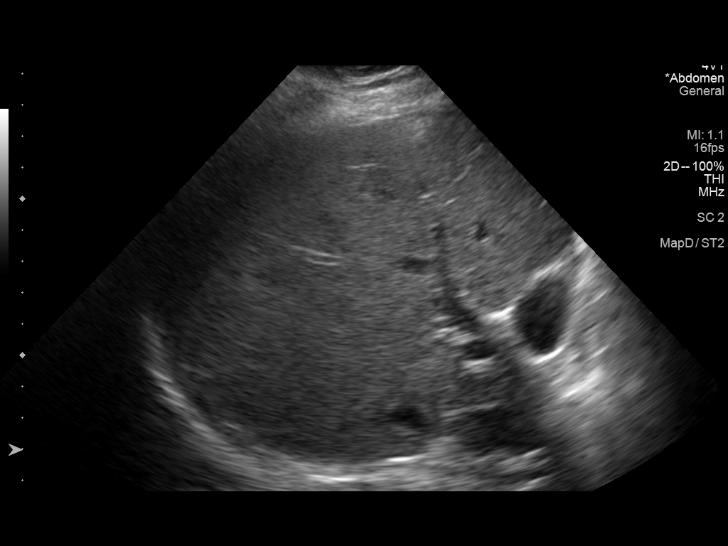
[im 56/103]
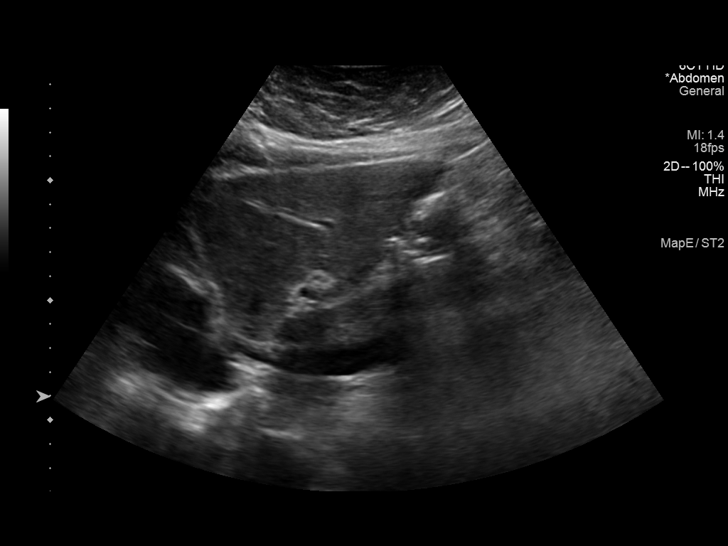
[im 64/103]
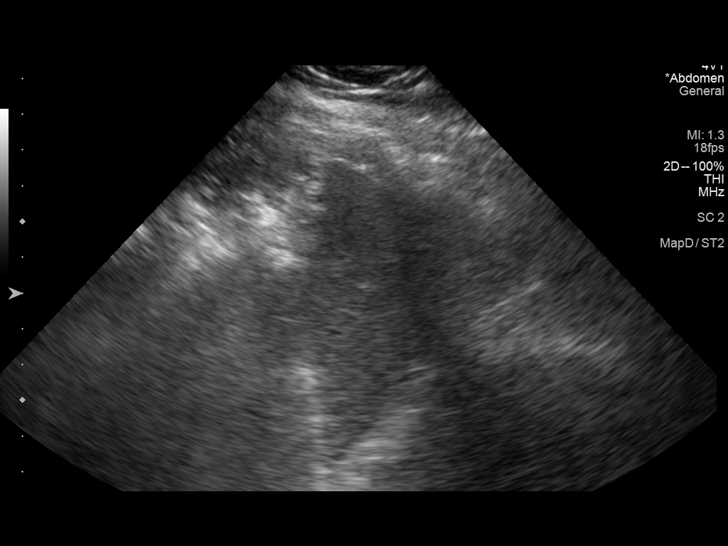
[im 69/103]
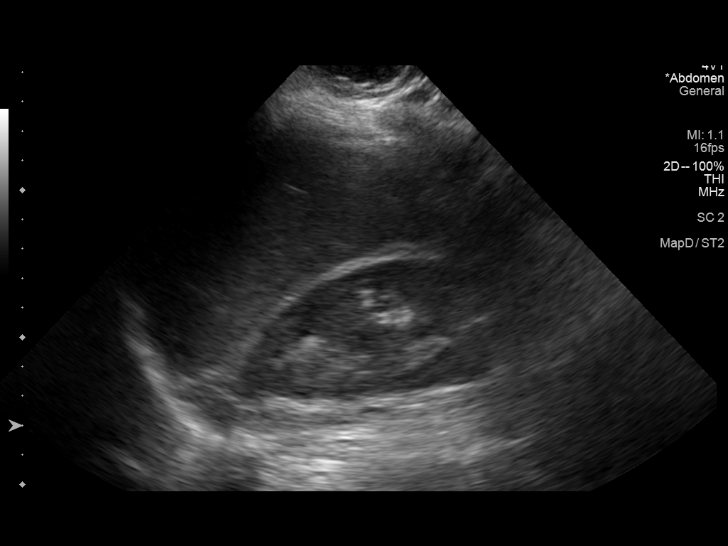
[im 77/103]
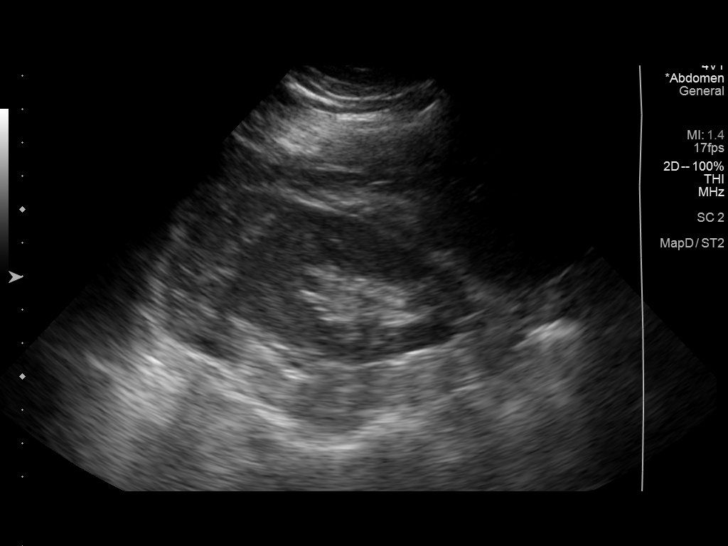
[im 86/103]
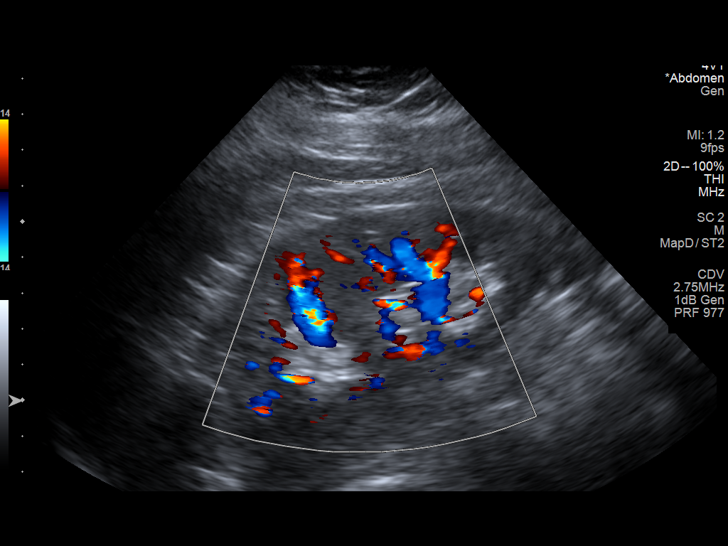
[im 94/103]
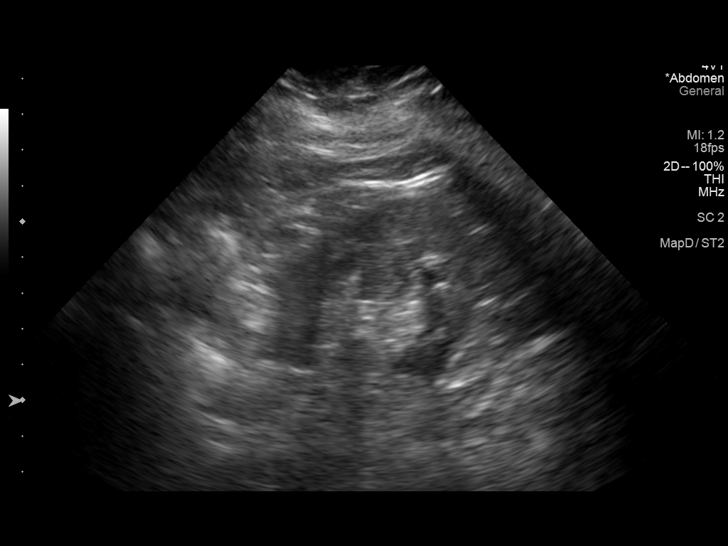
[im 103/103]
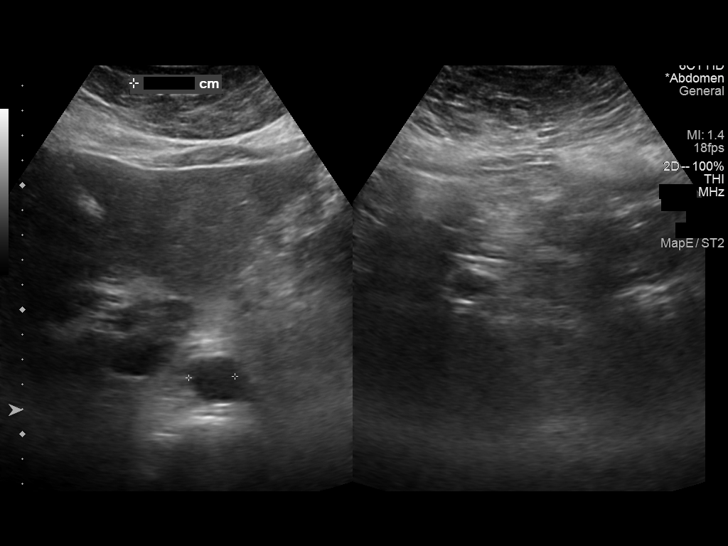

[14 of 25 positions shown; findings below may reference images not displayed]

FINDINGS: Gallbladder: No gallstones or wall thickening visualized. No
sonographic Murphy sign noted by sonographer.

Common bile duct: Diameter: 3.2 mm.

Liver: No focal lesion identified. Within normal limits in
parenchymal echogenicity. Portal vein is patent on color Doppler
imaging with normal direction of blood flow towards the liver.

IVC: No abnormality visualized.

Pancreas: Visualized portion unremarkable.

Spleen: Size and appearance within normal limits.

Right Kidney: Length: 12 cm. Echogenicity within normal limits. No
mass or hydronephrosis visualized.

Left Kidney: Length: 10.8 cm. Echogenicity within normal limits. No
mass or hydronephrosis visualized.

Abdominal aorta: No aneurysm visualized.

Other findings: None.
IMPRESSION: No acute abnormality noted.
